# Patient Record
Sex: Female | Born: 1965 | Race: White | Hispanic: No | Marital: Single | Smoking: Current every day smoker
Health system: Southern US, Community
[De-identification: ages and names within clinical notes are randomized; demographics above are authoritative.]

## PROBLEM LIST (undated history)

## (undated) DIAGNOSIS — F32A Depression, unspecified: Secondary | ICD-10-CM

## (undated) DIAGNOSIS — M1990 Unspecified osteoarthritis, unspecified site: Secondary | ICD-10-CM

## (undated) DIAGNOSIS — N879 Dysplasia of cervix uteri, unspecified: Secondary | ICD-10-CM

## (undated) DIAGNOSIS — N643 Galactorrhea not associated with childbirth: Secondary | ICD-10-CM

## (undated) DIAGNOSIS — Z9289 Personal history of other medical treatment: Secondary | ICD-10-CM

## (undated) DIAGNOSIS — F419 Anxiety disorder, unspecified: Secondary | ICD-10-CM

## (undated) DIAGNOSIS — F329 Major depressive disorder, single episode, unspecified: Secondary | ICD-10-CM

## (undated) HISTORY — DX: Anxiety disorder, unspecified: F41.9

## (undated) HISTORY — PX: HYSTEROSCOPY: SHX211

## (undated) HISTORY — DX: Personal history of other medical treatment: Z92.89

## (undated) HISTORY — DX: Major depressive disorder, single episode, unspecified: F32.9

## (undated) HISTORY — DX: Unspecified osteoarthritis, unspecified site: M19.90

## (undated) HISTORY — DX: Depression, unspecified: F32.A

## (undated) HISTORY — PX: TUBAL LIGATION: SHX77

## (undated) HISTORY — DX: Dysplasia of cervix uteri, unspecified: N87.9

## (undated) HISTORY — DX: Galactorrhea not associated with childbirth: N64.3

---
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
## 1987-12-01 HISTORY — PX: CERVICAL CONE BIOPSY: SUR198

## 2008-11-21 ENCOUNTER — Observation Stay: Payer: Self-pay

## 2009-01-16 ENCOUNTER — Inpatient Hospital Stay: Payer: Self-pay | Admitting: Obstetrics and Gynecology

## 2009-01-16 DIAGNOSIS — O321XX0 Maternal care for breech presentation, not applicable or unspecified: Secondary | ICD-10-CM

## 2009-11-30 DIAGNOSIS — N643 Galactorrhea not associated with childbirth: Secondary | ICD-10-CM

## 2009-11-30 HISTORY — DX: Galactorrhea not associated with childbirth: N64.3

## 2010-03-03 ENCOUNTER — Ambulatory Visit: Payer: Self-pay | Admitting: Obstetrics & Gynecology

## 2010-04-16 ENCOUNTER — Ambulatory Visit: Payer: Self-pay | Admitting: Internal Medicine

## 2011-09-07 LAB — HM PAP SMEAR

## 2011-12-08 LAB — HM MAMMOGRAPHY

## 2012-03-29 DIAGNOSIS — Z9289 Personal history of other medical treatment: Secondary | ICD-10-CM

## 2012-03-29 HISTORY — DX: Personal history of other medical treatment: Z92.89

## 2012-04-05 ENCOUNTER — Ambulatory Visit: Payer: Self-pay

## 2012-05-03 ENCOUNTER — Ambulatory Visit: Payer: Self-pay | Admitting: Obstetrics and Gynecology

## 2012-05-03 LAB — URINALYSIS, COMPLETE
Bilirubin,UR: NEGATIVE
Ketone: NEGATIVE
Leukocyte Esterase: NEGATIVE
Nitrite: NEGATIVE
Ph: 7 (ref 4.5–8.0)
RBC,UR: <1 /HPF (ref 0–5)
Specific Gravity: 1.004 (ref 1.003–1.030)
WBC UR: NONE SEEN /HPF (ref 0–5)

## 2012-05-03 LAB — CBC
HGB: 13 g/dL (ref 12.0–16.0)
MCH: 30.1 pg (ref 26.0–34.0)
MCHC: 32.9 g/dL (ref 32.0–36.0)
Platelet: 310 10*3/uL (ref 150–440)
WBC: 9.9 10*3/uL (ref 3.6–11.0)

## 2012-05-03 LAB — PREGNANCY, URINE: Pregnancy Test, Urine: NEGATIVE m[IU]/mL

## 2012-05-12 ENCOUNTER — Ambulatory Visit: Payer: Self-pay | Admitting: Obstetrics and Gynecology

## 2012-05-16 LAB — PATHOLOGY REPORT

## 2013-09-19 DIAGNOSIS — Z9289 Personal history of other medical treatment: Secondary | ICD-10-CM

## 2013-09-19 HISTORY — DX: Personal history of other medical treatment: Z92.89

## 2014-06-22 DIAGNOSIS — E559 Vitamin D deficiency, unspecified: Secondary | ICD-10-CM | POA: Insufficient documentation

## 2014-06-22 DIAGNOSIS — E221 Hyperprolactinemia: Secondary | ICD-10-CM | POA: Insufficient documentation

## 2014-06-22 DIAGNOSIS — N63 Unspecified lump in unspecified breast: Secondary | ICD-10-CM | POA: Insufficient documentation

## 2015-03-24 NOTE — Op Note (Signed)
PATIENT NAME:  Bianca Clay, Bianca Clay MR#:  [id number] DATE OF BIRTH:  1966-07-26  DATE OF PROCEDURE:  05/12/2012  PREOPERATIVE DIAGNOSIS: Submucosal fibroid.   POSTOPERATIVE DIAGNOSIS: Endometrial polyp.   PROCEDURE: Operative hysteroscopy with removal of polyp using MyoSure.   ANESTHESIA: General.   SURGEON: Sheryl Towell A. Weaver-Lee, MD   ESTIMATED BLOOD LOSS: Minimal.   OPERATIVE FLUIDS: 1 liter.   COMPLICATIONS: None.   FINDINGS: Polyp on the right posterior endometrium.   SPECIMEN TYPE: Endometrial polyp.   INDICATIONS: The patient is a 49 year old who presents with irregular menses and was found to have a filling defect in her endometrium by ultrasound as well as sonohysterogram. The patient desired operative management. Risks, benefits, indications, and alternatives of the procedure were explained and informed consent was obtained.   PROCEDURE: The patient was taken to the operating room with IV fluids running. She was prepped and draped in the usual sterile fashion in candy-cane stirrups. Speculum was placed inside the vagina. The anterior lip of the cervix was grasped with a single-tooth tenaculum. The uterus sounded to 8 cm. The cervix was dilated using a Hank dilator up to a #15. The MyoSure diagnostic hysteroscope was placed and findings were as previously stated. The rotating outflow channel was removed from the hysteroscope and the MyoSure apparatus was placed. Per manufacturer's instructions, the bladed area was placed over the endometrial polyp. Foot pedal was used to activate the blade and the polyp was morcellated. All polyp pieces were removed through the hysteroscope channel via suction and sent to pathology. At the end of the procedure all instrumentation was removed from the patient's uterus and vagina. Surgical areas were hemostatic at the end of the case. Sponge and instrument counts were correct x2. The patient was awakened from anesthesia and taken to the recovery room in  stable condition.    ____________________________ Rolm Gala Ferne Reus, MD law:drc D: 05/12/2012 16:00:03 ET T: 05/13/2012 06:58:42 ET JOB#: [id number]  cc: Sherlynn Carbon A. Ferne Reus, MD, <Dictator> Rolm Gala WEAVER LEE MD ELECTRONICALLY SIGNED 05/13/2012 8:29

## 2015-05-03 ENCOUNTER — Encounter: Payer: Self-pay | Admitting: Podiatry

## 2015-05-03 ENCOUNTER — Ambulatory Visit (INDEPENDENT_AMBULATORY_CARE_PROVIDER_SITE_OTHER): Payer: No Typology Code available for payment source

## 2015-05-03 ENCOUNTER — Ambulatory Visit (INDEPENDENT_AMBULATORY_CARE_PROVIDER_SITE_OTHER): Payer: No Typology Code available for payment source | Admitting: Podiatry

## 2015-05-03 VITALS — BP 118/63 | HR 66 | Resp 16 | Ht 68.0 in | Wt 210.0 lb

## 2015-05-03 DIAGNOSIS — M722 Plantar fascial fibromatosis: Secondary | ICD-10-CM | POA: Diagnosis not present

## 2015-05-03 DIAGNOSIS — Q6650 Congenital pes planus, unspecified foot: Secondary | ICD-10-CM | POA: Diagnosis not present

## 2015-05-03 DIAGNOSIS — M2010 Hallux valgus (acquired), unspecified foot: Secondary | ICD-10-CM

## 2015-05-03 MED ORDER — METHYLPREDNISOLONE 4 MG PO TBPK: ORAL_TABLET | ORAL | Status: DC

## 2015-05-03 MED ORDER — MELOXICAM 15 MG PO TABS: 15.0000 mg | ORAL_TABLET | Freq: Every day | ORAL | Status: DC

## 2015-05-03 NOTE — Progress Notes (Signed)
   Subjective:    Patient ID: Bianca Clay, female    DOB: [birth year], 49 y.o.   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 i feel crippled by the  End of the day. I bought new shoes and want to make sure they are a good pair for me,.    Review of Systems  All other systems reviewed and are negative.      Objective:   Physical Exam: I have reviewed her past medical history medications allergy surgery social history and review of systems. Pulses are strongly palpable bilateral. Neurologic sensorium is intact percent lasting monofilament. Deep tendon reflexes are intact bilateral and muscle strength +5 over 5 dorsiflexion plantar flexors and inverters everters all intrinsic musculature is intact. Orthopedic evaluation demonstrates all joints distal to the ankle for range of motion without crepitation. She has moderate hallux valgus deformities left greater than right. She has mild pes planus with pain on palpation medial calcaneal tubercles bilaterally. Radiographs confirm plantar distally oriented calcaneal heel spurs with soft tissue increase in density at the plantar fascial calcaneal insertion site bilaterally. Hallux valgus deformities are noted bilateral.        Assessment & Plan:  Assessment: Plantar fasciitis bilateral with pes planus and hallux valgus bilateral.  Plan: Injected the bilateral heels today with Kenalog and local and aesthetic. Dispensed plantar fascial braces and a night splint. Dispensed a prescription for Medrol Dosepak to be followed by meloxicam. We discussed the etiology pathology conservative versus surgical therapies. We discussed the long-term benefits of orthotics. We discussed appropriate shoe gear stretching exercises ice therapy and shoe modifications. She was scanned for orthotics today.

## 2015-06-05 ENCOUNTER — Ambulatory Visit (INDEPENDENT_AMBULATORY_CARE_PROVIDER_SITE_OTHER): Payer: No Typology Code available for payment source | Admitting: Podiatry

## 2015-06-05 ENCOUNTER — Encounter: Payer: Self-pay | Admitting: Podiatry

## 2015-06-05 DIAGNOSIS — M722 Plantar fascial fibromatosis: Secondary | ICD-10-CM

## 2015-06-05 NOTE — Progress Notes (Signed)
Patient presents today to pick up her orthotics. She states that her feet have been feeling much better with the anti-inflammatory medication. I reviewed and dispensed a copy of the orthotic instructions. Advised she may want to continue with meloxicam while adjusting to the orthotics. Follow up appointment was made for 1 month to see Dr. Milinda Pointer for follow up.

## 2015-06-05 NOTE — Patient Instructions (Signed)

## 2015-07-04 DIAGNOSIS — F329 Major depressive disorder, single episode, unspecified: Secondary | ICD-10-CM

## 2015-07-04 DIAGNOSIS — J309 Allergic rhinitis, unspecified: Secondary | ICD-10-CM | POA: Insufficient documentation

## 2015-07-04 DIAGNOSIS — Z720 Tobacco use: Secondary | ICD-10-CM

## 2015-07-04 DIAGNOSIS — F419 Anxiety disorder, unspecified: Secondary | ICD-10-CM | POA: Insufficient documentation

## 2015-07-04 DIAGNOSIS — F32A Depression, unspecified: Secondary | ICD-10-CM | POA: Insufficient documentation

## 2015-07-04 DIAGNOSIS — F17210 Nicotine dependence, cigarettes, uncomplicated: Secondary | ICD-10-CM | POA: Insufficient documentation

## 2015-07-08 ENCOUNTER — Ambulatory Visit: Payer: No Typology Code available for payment source | Admitting: Podiatry

## 2015-07-08 ENCOUNTER — Ambulatory Visit (INDEPENDENT_AMBULATORY_CARE_PROVIDER_SITE_OTHER): Payer: No Typology Code available for payment source | Admitting: Unknown Physician Specialty

## 2015-07-08 ENCOUNTER — Encounter: Payer: Self-pay | Admitting: Unknown Physician Specialty

## 2015-07-08 VITALS — BP 106/69 | HR 90 | Temp 98.4°F | Ht 68.0 in | Wt 201.8 lb

## 2015-07-08 DIAGNOSIS — F329 Major depressive disorder, single episode, unspecified: Secondary | ICD-10-CM

## 2015-07-08 DIAGNOSIS — F32A Depression, unspecified: Secondary | ICD-10-CM

## 2015-07-08 MED ORDER — VENLAFAXINE HCL ER 37.5 MG PO CP24: 37.5000 mg | ORAL_CAPSULE | Freq: Every day | ORAL | Status: DC

## 2015-07-08 NOTE — Assessment & Plan Note (Signed)
Poor control.  Has a lot of stressors.  Venlafaxine helped in the past.  Gave a list of counselors.

## 2015-07-08 NOTE — Progress Notes (Signed)
   BP 106/69 mmHg  Pulse 90  Temp(Src) 98.4 F (36.9 C)  Ht 5\' 8"  (1.727 m)  Wt 201 lb 12.8 oz (91.536 kg)  BMI 30.69 kg/m2  SpO2 97%  LMP 06/26/2015 (Exact Date)   Subjective:    Patient ID: Bianca Clay, female    DOB: [birth year], 49 y.o.   MRN: [id number]  HPI: Bianca Clay is a 49 y.o. female  Chief Complaint  Patient presents with  . Depression   Depression Pt is here for a combination of stress, depression, and anxiety. She is on Dostinex through Dr. Eddie Dibbles for a pituitary adenoma.   She has been on this for many years now.  She has a lot of stress with a difficult child She just filed for bankruptsy.  She can sleep all day if she allowed herself, feeling tired and feeling bad about herself.  Her child is getting counseling.    Relevant past medical, surgical, family and social history reviewed and updated as indicated. Interim medical history since our last visit reviewed. Allergies and medications reviewed and updated.  Review of Systems  Constitutional: Negative.   HENT: Negative.   Eyes: Negative.   Respiratory: Negative.   Cardiovascular: Negative.   Gastrointestinal: Negative.   Endocrine: Negative.   Genitourinary: Negative.   Musculoskeletal: Negative.   Skin: Negative.   Allergic/Immunologic: Negative.   Neurological: Negative.   Hematological: Negative.   Psychiatric/Behavioral: Negative.     Per HPI unless specifically indicated above     Objective:    BP 106/69 mmHg  Pulse 90  Temp(Src) 98.4 F (36.9 C)  Ht 5\' 8"  (1.727 m)  Wt 201 lb 12.8 oz (91.536 kg)  BMI 30.69 kg/m2  SpO2 97%  LMP 06/26/2015 (Exact Date)  Wt Readings from Last 3 Encounters:  07/08/15 201 lb 12.8 oz (91.536 kg)  04/11/14 201 lb (91.173 kg)  05/03/15 210 lb (95.255 kg)    Physical Exam  Constitutional: She is oriented to person, place, and time. She appears well-developed and well-nourished. No distress.  HENT:  Head: Normocephalic and atraumatic.  Eyes:  Conjunctivae and lids are normal. Right eye exhibits no discharge. Left eye exhibits no discharge. No scleral icterus.  Cardiovascular: Normal rate, regular rhythm and normal heart sounds.   Pulmonary/Chest: Effort normal and breath sounds normal. No respiratory distress.  Abdominal: Normal appearance. There is no splenomegaly or hepatomegaly.  Musculoskeletal: Normal range of motion.  Neurological: She is alert and oriented to person, place, and time.  Skin: Skin is intact. No rash noted. No pallor.  Psychiatric: She has a normal mood and affect. Her behavior is normal. Judgment and thought content normal.      Assessment & Plan:   Problem List Items Addressed This Visit      Unprioritized   Depression - Primary    Poor control.  Has a lot of stressors.  Venlafaxine helped in the past.  Gave a list of counselors.        Relevant Medications   venlafaxine XR (EFFEXOR XR) 37.5 MG 24 hr capsule       Follow up plan: Return in about 4 weeks (around 08/05/2015).

## 2015-08-02 ENCOUNTER — Telehealth: Payer: Self-pay | Admitting: Unknown Physician Specialty

## 2015-08-02 NOTE — Telephone Encounter (Signed)
Pt supposed to fu on Sept 12, she is booked with Dr Wynetta Emery, not sure why other than maybe because [facility] Mood is away.  She wanted to follow up with Malachy Mood but starts a new job on the 12th.  Mal[facility] Mood is full next week.  Pt states is just to fu on starting new med, can she talk to [place]? Can we give her a 15 min appt?  Please call pt to let her know what she should do.

## 2015-08-02 NOTE — Telephone Encounter (Signed)
Routing to provider. Do you want to call and speak with her or do you want her to come in for a 15 minute appointment?

## 2015-08-02 NOTE — Telephone Encounter (Signed)
Please put her in for a 15 minute appointment next week.

## 2015-08-12 ENCOUNTER — Ambulatory Visit (INDEPENDENT_AMBULATORY_CARE_PROVIDER_SITE_OTHER): Payer: No Typology Code available for payment source | Admitting: Family Medicine

## 2015-08-12 ENCOUNTER — Encounter: Payer: Self-pay | Admitting: Family Medicine

## 2015-08-12 VITALS — BP 132/77 | HR 74 | Temp 99.3°F | Ht 67.9 in | Wt 200.4 lb

## 2015-08-12 DIAGNOSIS — F329 Major depressive disorder, single episode, unspecified: Secondary | ICD-10-CM

## 2015-08-12 DIAGNOSIS — F32A Depression, unspecified: Secondary | ICD-10-CM

## 2015-08-12 MED ORDER — VENLAFAXINE HCL ER 37.5 MG PO CP24: 75.0000 mg | ORAL_CAPSULE | Freq: Every day | ORAL | Status: DC

## 2015-08-12 NOTE — Assessment & Plan Note (Signed)
Under good control on current regimen. Continue current regimen. Follow up in 6 months on her depression and for PE.

## 2015-08-12 NOTE — Progress Notes (Signed)
BP 132/77 mmHg  Pulse 74  Temp(Src) 99.3 F (37.4 C)  Ht 5' 7.9" (1.725 m)  Wt 200 lb 6.4 oz (90.901 kg)  BMI 30.55 kg/m2  SpO2 96%   Subjective:    Patient ID: Bianca Clay, female    DOB: 1966-11-03, 49 y.o.   MRN: [id number]  HPI: Bianca Clay is a 49 y.o. female  Chief Complaint  Patient presents with  . Depression    pt states she has been taking 2 Effexor a day because that is what she thought she and Malachy Mood discussed   DEPRESSION Mood status: controlled Satisfied with current treatment?: no Symptom severity: mild  Duration of current treatment : 1 months Side effects: no Medication compliance: excellent compliance Depressed mood: yes Anxious mood: yes Anhedonia: no Significant weight loss or gain: no Insomnia: yes hard to stay asleep Fatigue: yes Feelings of worthlessness or guilt: no Impaired concentration/indecisiveness: no Suicidal ideations: no Hopelessness: no Crying spells: no Depression screen [facility] 2/9 08/12/2015 07/08/2015  Decreased Interest 0 2  Down, Depressed, Hopeless 1 3  PHQ - 2 Score 1 5  Altered sleeping 1 2  Tired, decreased energy 1 1  Change in appetite 1 1  Feeling bad or failure about yourself  0 0  Trouble concentrating 0 0  Moving slowly or fidgety/restless 0 0  Suicidal thoughts 0 0  PHQ-9 Score 4 9    Relevant past medical, surgical, family and social history reviewed and updated as indicated. Interim medical history since our last visit reviewed. Allergies and medications reviewed and updated.  Review of Systems  Constitutional: Negative.   Respiratory: Negative.   Cardiovascular: Negative.   Psychiatric/Behavioral: Negative.     Per HPI unless specifically indicated above     Objective:    BP 132/77 mmHg  Pulse 74  Temp(Src) 99.3 F (37.4 C)  Ht 5' 7.9" (1.725 m)  Wt 200 lb 6.4 oz (90.901 kg)  BMI 30.55 kg/m2  SpO2 96%  Wt Readings from Last 3 Encounters:  08/12/15 200 lb 6.4 oz (90.901 kg)  07/08/15  201 lb 12.8 oz (91.536 kg)  04/11/14 201 lb (91.173 kg)    Physical Exam  Constitutional: She is oriented to person, place, and time. She appears well-developed and well-nourished. No distress.  HENT:  Head: Normocephalic and atraumatic.  Right Ear: Hearing normal.  Left Ear: Hearing normal.  Nose: Nose normal.  Eyes: Conjunctivae and lids are normal. Right eye exhibits no discharge. Left eye exhibits no discharge. No scleral icterus.  Cardiovascular: Normal rate, regular rhythm, normal heart sounds and intact distal pulses.  Exam reveals no gallop and no friction rub.   No murmur heard. Pulmonary/Chest: Effort normal and breath sounds normal. No respiratory distress. She has no wheezes. She has no rales. She exhibits no tenderness.  Musculoskeletal: Normal range of motion.  Neurological: She is alert and oriented to person, place, and time.  Skin: Skin is warm, dry and intact. No rash noted. No erythema. No pallor.  Psychiatric: She has a normal mood and affect. Her speech is normal and behavior is normal. Judgment and thought content normal. Cognition and memory are normal.  Nursing note and vitals reviewed.   Results for orders placed or performed in visit on 07/04/15  HM MAMMOGRAPHY  Result Value Ref Range   HM Mammogram from PP   HM PAP SMEAR  Result Value Ref Range   HM Pap smear from PP       Assessment & Plan:  Problem List Items Addressed This Visit      Other   Depression - Primary    Under good control on current regimen. Continue current regimen. Follow up in 6 months on her depression and for PE.       Relevant Medications   venlafaxine XR (EFFEXOR XR) 37.5 MG 24 hr capsule       Follow up plan: Return in about 6 months (around 02/09/2016) for PE with Malachy Mood.

## 2016-02-10 ENCOUNTER — Ambulatory Visit: Payer: No Typology Code available for payment source | Admitting: Unknown Physician Specialty

## 2016-04-02 ENCOUNTER — Ambulatory Visit (INDEPENDENT_AMBULATORY_CARE_PROVIDER_SITE_OTHER): Payer: BLUE CROSS/BLUE SHIELD | Admitting: Podiatry

## 2016-04-02 ENCOUNTER — Encounter: Payer: Self-pay | Admitting: Podiatry

## 2016-04-02 ENCOUNTER — Ambulatory Visit (INDEPENDENT_AMBULATORY_CARE_PROVIDER_SITE_OTHER): Payer: BLUE CROSS/BLUE SHIELD

## 2016-04-02 VITALS — BP 120/74 | HR 79 | Resp 18

## 2016-04-02 DIAGNOSIS — M7662 Achilles tendinitis, left leg: Secondary | ICD-10-CM | POA: Diagnosis not present

## 2016-04-02 DIAGNOSIS — R52 Pain, unspecified: Secondary | ICD-10-CM

## 2016-04-02 DIAGNOSIS — M722 Plantar fascial fibromatosis: Secondary | ICD-10-CM | POA: Diagnosis not present

## 2016-04-02 MED ORDER — MELOXICAM 15 MG PO TABS: 15.0000 mg | ORAL_TABLET | Freq: Every day | ORAL | Status: DC

## 2016-04-02 NOTE — Patient Instructions (Signed)

## 2016-04-02 NOTE — Progress Notes (Signed)
Patient ID: Bianca Clay, female   DOB: Jun 20, 1966, 50 y.o.   MRN: [id number]  Subjective: 50 year old female presents the office they for recurrent left heel pain which has been ongoing for approximately one month. She states that she has not been wearing shoes and orthotics and she is wearing flip-flops when the pain started. She feels a pulling sensation to her heel. She denies any recent injury or trauma. His been no swelling or redness. No tingling or numbness. She's had no recent treatment. The pain does not wake her up at night. Denies any systemic complaints such as fevers, chills, nausea, vomiting. No acute changes since last appointment, and no other complaints at this time.   Objective: AAO x3, NAD DP/PT pulses palpable bilaterally, CRT less than 3 seconds Tenderness to palpation along the plantar medial tubercle of the calcaneus at the insertion of plantar fascia on the leftfoot. There is no pain along the course of the plantar fascia within the arch of the foot. Plantar fascia appears to be intact. There is no pain with lateral compression of the calcaneus or pain with vibratory sensation. There is mild pain along the insertion of the achilles tendon. Thompson test is negative. No other areas of tenderness to bilateral lower extremities. Equinus is present.  No areas of pinpoint bony tenderness or pain with vibratory sensation. MMT 5/5, ROM WNL. No edema, erythema, increase in warmth to bilateral lower extremities.  No open lesions or pre-ulcerative lesions.  No pain with calf compression, swelling, warmth, erythema  Assessment: 50 year old female with reoccurant left heel pain, likely planar fasciitis and achilles tendonitis  Plan: -All treatment options discussed with the patient including all alternatives, risks, complications.  -X-rays were obtained and reviewed with the patient. Small inferior calcaneal spur present. No evidence of acute fracture. -Etiology of symptoms were  discussed -Patient elects to proceed with steroid injection into the left heel. Under sterile skin preparation, a total of 2.5cc of kenalog 10, 0.5% Marcaine plain, and 2% lidocaine plain were infiltrated into the symptomatic area without complication. A band-aid was applied. Patient tolerated the injection well without complication. Post-injection care with discussed with the patient. Discussed with the patient to ice the area over the next couple of days to help prevent a steroid flare.  -Night splint dispensed(her dog ate her last one) -Stretching -Icing -Continue supportive shoe gear and orthotics. -F/U 3 weeks -Patient encouraged to call the office with any questions, concerns, change in symptoms.   Celesta Gentile, DPM

## 2016-04-02 NOTE — Addendum Note (Signed)
Addended by: Celesta Gentile R on: 04/02/2016 03:52 PM   Modules accepted: Orders

## 2016-04-23 ENCOUNTER — Ambulatory Visit (INDEPENDENT_AMBULATORY_CARE_PROVIDER_SITE_OTHER): Payer: BLUE CROSS/BLUE SHIELD | Admitting: Podiatry

## 2016-04-23 ENCOUNTER — Encounter: Payer: Self-pay | Admitting: Podiatry

## 2016-04-23 VITALS — BP 121/71 | HR 66 | Resp 18

## 2016-04-23 DIAGNOSIS — M79673 Pain in unspecified foot: Secondary | ICD-10-CM

## 2016-04-23 DIAGNOSIS — M722 Plantar fascial fibromatosis: Secondary | ICD-10-CM | POA: Diagnosis not present

## 2016-04-30 NOTE — Progress Notes (Signed)
Patient ID: Bianca Clay, female   DOB: 1966/07/12, 50 y.o.   MRN: [id number]  Subjective: 25 mL female presents the office today for flap evaluated the left heel pain, plantar fasciitis as well as Achilles tendinitis. She states that she is doing significantly better than last appointment. She gets an occasional "ting" to the area but is much improved. She has continue with stretching, icing as well as supportive shoes. Denies any systemic complaints such as fevers, chills, nausea, vomiting. No acute changes since last appointment, and no other complaints at this time.   Objective: AAO x3, NAD DP/PT pulses palpable bilaterally, CRT less than 3 seconds There is no tenderness to palpation along the plantar medial tubercle of the calcaneus at the insertion of plantar fascia on the left foot. There is no pain along the course of the plantar fascia within the arch of the foot. Plantar fascia appears to be intact. There is no pain with lateral compression of the calcaneus or pain with vibratory sensation. There is no pain along the course or insertion of the achilles tendon. No other areas of tenderness to bilateral lower extremities. No areas of pinpoint bony tenderness or pain with vibratory sensation. MMT 5/5, ROM WNL. No edema, erythema, increase in warmth to bilateral lower extremities.  No open lesions or pre-ulcerative lesions.  No pain with calf compression, swelling, warmth, erythema  Assessment: Resolved left heel pain, plantar fasciitis  Plan: -All treatment options discussed with the patient including all alternatives, risks, complications.  -Discussed methods to help prevent recurrence. Discussed continue stretching, icing and anti-inflammatories as needed. Supportive shoe gear and orthotics. Follow-up the patient symptoms recur. -Patient encouraged to call the office with any questions, concerns, change in symptoms.   Celesta Gentile, DPM

## 2016-08-28 ENCOUNTER — Other Ambulatory Visit: Payer: Self-pay | Admitting: Family Medicine

## 2016-08-28 NOTE — Telephone Encounter (Signed)
Your patient 

## 2016-09-25 ENCOUNTER — Telehealth: Payer: Self-pay | Admitting: *Deleted

## 2016-09-25 NOTE — Telephone Encounter (Signed)
REceived request for Meloxicam 15mg  #90. Dr. Jacqualyn Posey denied,pt not seen in office since 03/2016, needs an appt. Return fax as denied.

## 2016-09-27 ENCOUNTER — Other Ambulatory Visit: Payer: Self-pay | Admitting: Podiatry

## 2016-10-14 ENCOUNTER — Other Ambulatory Visit: Payer: Self-pay | Admitting: Podiatry

## 2017-02-01 ENCOUNTER — Ambulatory Visit (INDEPENDENT_AMBULATORY_CARE_PROVIDER_SITE_OTHER): Payer: BLUE CROSS/BLUE SHIELD | Admitting: Obstetrics and Gynecology

## 2017-02-01 VITALS — BP 110/70 | Ht 70.0 in | Wt 215.0 lb

## 2017-02-01 DIAGNOSIS — Z01419 Encounter for gynecological examination (general) (routine) without abnormal findings: Secondary | ICD-10-CM | POA: Diagnosis not present

## 2017-02-01 DIAGNOSIS — Z1231 Encounter for screening mammogram for malignant neoplasm of breast: Secondary | ICD-10-CM | POA: Diagnosis not present

## 2017-02-01 DIAGNOSIS — Z720 Tobacco use: Secondary | ICD-10-CM

## 2017-02-01 DIAGNOSIS — Z780 Asymptomatic menopausal state: Secondary | ICD-10-CM | POA: Diagnosis not present

## 2017-02-01 NOTE — Progress Notes (Signed)
HPI:      Ms. Bianca Clay is a 51 y.o. G1P1001 who LMP was No LMP recorded., presents today for her annual examination.  Her menses are absent due to menopause.  She is not sexually active  Last Pap: 05/14/15  Results were: no abnormalities  /neg HPV DNA. Hx of STDs: none Last mammogram: 05/14/15. Results were: normal--routine follow-up in 12 months There is no FH of breast or ovarian cancer. The patient does/ not do self-breast exams.   Tobacco use: The patient currently smokes 1 packs of cigarettes per day for the past many years. Alcohol use: none Exercise: moderately active  Lipids WNL 2016.  She does get adequate calcium and Vitamin D in her diet.  Past Medical History:  Diagnosis Date  . Anxiety   . Cervical dysplasia   . Depression   . Galactorrhea not associated with childbirth 2011   DR. BALL  . History of mammogram 09/19/2013   BIRAD I  . History of Papanicolaou smear of cervix 03/29/2012   -/-    Past Surgical History:  Procedure Laterality Date  . CERVICAL CONE BIOPSY  1989  . CESAREAN SECTION     X1  . HYSTEROSCOPY    . TONSILLECTOMY  1980  . TUBAL LIGATION      Family History  Problem Relation Age of Onset  . Diabetes Mother   . Hypertension Mother   . Diabetes Father   . Hypertension Father   . Depression Sister   . Hypertension Sister   . Allergies Son   . Depression Sister      ROS:  Review of Systems  Constitutional: Negative for fever, malaise/fatigue and weight loss.  HENT: Negative for congestion, ear pain and sinus pain.   Respiratory: Negative for cough, shortness of breath and wheezing.   Cardiovascular: Negative for chest pain, orthopnea and leg swelling.  Gastrointestinal: Negative for constipation, diarrhea, nausea and vomiting.  Genitourinary: Negative for dysuria, frequency, hematuria and urgency.  Musculoskeletal: Negative for back pain, joint pain and myalgias.  Skin: Negative for itching and rash.  Neurological:  Negative for dizziness, tingling, focal weakness and headaches.  Endo/Heme/Allergies: Negative for environmental allergies. Does not bruise/bleed easily.  Psychiatric/Behavioral: Negative for depression and suicidal ideas. The patient is not nervous/anxious and does not have insomnia.     Objective: BP 110/70 (BP Location: Right Arm, Patient Position: Sitting)   Ht 5\' 10"  (1.778 m)   Wt 215 lb (97.5 kg)   BMI 30.85 kg/m    Physical Exam  Constitutional: She is oriented to person, place, and time. She appears well-developed and well-nourished.  Genitourinary: Vagina normal and uterus normal. No erythema or tenderness in the vagina. No vaginal discharge found. Right adnexum does not display mass and does not display tenderness. Left adnexum does not display mass and does not display tenderness. Cervix does not exhibit motion tenderness or polyp. Uterus is not enlarged or tender.  Neck: Normal range of motion. No thyromegaly present.  Cardiovascular: Normal rate, regular rhythm and normal heart sounds.   No murmur heard. Pulmonary/Chest: Effort normal and breath sounds normal. Right breast exhibits no mass, no nipple discharge, no skin change and no tenderness. Left breast exhibits no mass, no nipple discharge, no skin change and no tenderness.  Abdominal: Soft. There is no tenderness. There is no guarding.  Musculoskeletal: Normal range of motion.  Neurological: She is alert and oriented to person, place, and time. No cranial nerve deficit.  Psychiatric: She has a  normal mood and affect. Her behavior is normal.  Vitals reviewed.   Results: No results found for this or any previous visit (from the past 24 hour(s)).  Assessment: Encounter for annual routine gynecological examination  Screening mammogram, encounter for - Plan: MM DIGITAL SCREENING BILATERAL  Tobacco abuse - Encouraged smokingn cessation.   Postmenopause     Plan:            GYN counsel mammography screening,  adequate intake of calcium and vitamin D    Return in about 1 year (around 02/01/2018).  Bianca Clay B. Deavion Strider, PA-C 02/01/2017 8:17 AM

## 2017-04-05 ENCOUNTER — Ambulatory Visit
Admission: RE | Admit: 2017-04-05 | Discharge: 2017-04-05 | Disposition: A | Discharge status: Home / Self Care | Payer: BLUE CROSS/BLUE SHIELD | Source: Ambulatory Visit | Attending: Obstetrics and Gynecology | Admitting: Obstetrics and Gynecology

## 2017-04-05 DIAGNOSIS — Z1231 Encounter for screening mammogram for malignant neoplasm of breast: Secondary | ICD-10-CM | POA: Diagnosis present

## 2017-04-05 DIAGNOSIS — R928 Other abnormal and inconclusive findings on diagnostic imaging of breast: Secondary | ICD-10-CM | POA: Diagnosis not present

## 2017-04-07 ENCOUNTER — Other Ambulatory Visit: Payer: Self-pay | Admitting: Obstetrics and Gynecology

## 2017-04-07 DIAGNOSIS — N6489 Other specified disorders of breast: Secondary | ICD-10-CM

## 2017-04-07 DIAGNOSIS — R928 Other abnormal and inconclusive findings on diagnostic imaging of breast: Secondary | ICD-10-CM

## 2017-04-12 ENCOUNTER — Other Ambulatory Visit: Payer: Self-pay | Admitting: *Deleted

## 2017-04-12 ENCOUNTER — Inpatient Hospital Stay
Admission: RE | Admit: 2017-04-12 | Discharge: 2017-04-12 | Disposition: A | Discharge status: Home / Self Care | Payer: Self-pay | Source: Ambulatory Visit | Attending: *Deleted | Admitting: *Deleted

## 2017-04-12 DIAGNOSIS — Z9289 Personal history of other medical treatment: Secondary | ICD-10-CM

## 2017-04-13 ENCOUNTER — Other Ambulatory Visit: Payer: Self-pay | Admitting: Unknown Physician Specialty

## 2017-04-15 ENCOUNTER — Ambulatory Visit
Admission: RE | Admit: 2017-04-15 | Discharge: 2017-04-15 | Disposition: A | Discharge status: Home / Self Care | Payer: BLUE CROSS/BLUE SHIELD | Source: Ambulatory Visit | Attending: Obstetrics and Gynecology | Admitting: Obstetrics and Gynecology

## 2017-04-15 ENCOUNTER — Encounter: Payer: Self-pay | Admitting: Obstetrics and Gynecology

## 2017-04-15 DIAGNOSIS — N6489 Other specified disorders of breast: Secondary | ICD-10-CM

## 2017-04-15 DIAGNOSIS — R928 Other abnormal and inconclusive findings on diagnostic imaging of breast: Secondary | ICD-10-CM

## 2018-03-27 ENCOUNTER — Other Ambulatory Visit: Payer: Self-pay | Admitting: Unknown Physician Specialty

## 2018-07-08 ENCOUNTER — Encounter: Payer: Self-pay | Admitting: Family Medicine

## 2018-07-08 ENCOUNTER — Other Ambulatory Visit: Payer: Self-pay

## 2018-07-08 ENCOUNTER — Ambulatory Visit: Payer: BLUE CROSS/BLUE SHIELD | Admitting: Family Medicine

## 2018-07-08 VITALS — BP 111/69 | HR 85 | Temp 98.1°F | Ht 68.0 in | Wt 203.6 lb

## 2018-07-08 DIAGNOSIS — F331 Major depressive disorder, recurrent, moderate: Secondary | ICD-10-CM | POA: Diagnosis not present

## 2018-07-08 MED ORDER — SERTRALINE HCL 50 MG PO TABS: 50.0000 mg | ORAL_TABLET | Freq: Every day | ORAL | 1 refills | Status: DC

## 2018-07-08 NOTE — Progress Notes (Signed)
BP 111/69   Pulse 85   Temp 98.1 F (36.7 C) (Oral)   Ht 5\' 8"  (1.727 m)   Wt 203 lb 9.6 oz (92.4 kg)   LMP 06/26/2015 (Exact Date)   SpO2 95%   BMI 30.96 kg/m    Subjective:    Patient ID: Bianca Clay, female    DOB: [birth year], 52 y.o.   MRN: [id number]  HPI: [name] is a 52 y.o. female  Chief Complaint  Patient presents with  . Anxiety   Pt here today for worsening anxiety that she feels is exacerbated by her job stress. Constantly irritable and on edge. Feels she is snapping at her 50 year old son when she's home almost constantly. Previously on effexor, has been out for a few months. Was on this medication for years but at the end felt like it became much less effective. Denies Si/HI, severe mood swings, hallucinations, appetite issues, hopelessness.    Depression screen [facility] 2/9 08/12/2015 07/08/2015  Decreased Interest 0 2  Down, Depressed, Hopeless 1 3  PHQ - 2 Score 1 5  Altered sleeping 1 2  Tired, decreased energy 1 1  Change in appetite 1 1  Feeling bad or failure about yourself  0 0  Trouble concentrating 0 0  Moving slowly or fidgety/restless 0 0  Suicidal thoughts 0 0  PHQ-9 Score 4 9  No flowsheet data found.  Relevant past medical, surgical, family and social history reviewed and updated as indicated. Interim medical history since our last visit reviewed. Allergies and medications reviewed and updated.  Review of Systems  Per HPI unless specifically indicated above     Objective:    BP 111/69   Pulse 85   Temp 98.1 F (36.7 C) (Oral)   Ht 5\' 8"  (1.727 m)   Wt 203 lb 9.6 oz (92.4 kg)   LMP 06/26/2015 (Exact Date)   SpO2 95%   BMI 30.96 kg/m   Wt Readings from Last 3 Encounters:  07/08/18 203 lb 9.6 oz (92.4 kg)  02/01/17 215 lb (97.5 kg)  05/14/15 207 lb (93.9 kg)    Physical Exam  Constitutional: She is oriented to person, place, and time. She appears well-developed and well-nourished. No distress.  HENT:  Head:  Atraumatic.  Eyes: Conjunctivae and EOM are normal.  Neck: Normal range of motion. Neck supple.  Cardiovascular: Normal rate and regular rhythm.  Pulmonary/Chest: Effort normal and breath sounds normal.  Musculoskeletal: Normal range of motion.  Neurological: She is alert and oriented to person, place, and time.  Skin: Skin is warm and dry.  Psychiatric: She has a normal mood and affect. Her behavior is normal.  Nursing note and vitals reviewed.   Results for orders placed or performed in visit on 07/04/15  HM MAMMOGRAPHY  Result Value Ref Range   HM Mammogram from PP   HM PAP SMEAR  Result Value Ref Range   HM Pap smear from PP       Assessment & Plan:   Problem List Items Addressed This Visit      Other   Depression - Primary    Will try zoloft as the effexor was becoming less effective near the end. Risks and side effects reviewed. List of counselors given for coping strategies.       Relevant Medications   sertraline (ZOLOFT) 50 MG tablet       Follow up plan: Return in about 4 weeks (around 08/05/2018) for Mood f/u.

## 2018-07-11 NOTE — Patient Instructions (Signed)
Follow up in 1 month   

## 2018-07-11 NOTE — Assessment & Plan Note (Signed)
Will try zoloft as the effexor was becoming less effective near the end. Risks and side effects reviewed. List of counselors given for coping strategies.

## 2018-07-22 ENCOUNTER — Encounter: Payer: Self-pay | Admitting: Podiatry

## 2018-07-22 ENCOUNTER — Ambulatory Visit: Payer: BLUE CROSS/BLUE SHIELD | Admitting: Podiatry

## 2018-07-22 DIAGNOSIS — L600 Ingrowing nail: Secondary | ICD-10-CM | POA: Diagnosis not present

## 2018-07-22 NOTE — Patient Instructions (Signed)

## 2018-07-25 NOTE — Progress Notes (Signed)
   Subjective: Patient presents today for evaluation of pain to the lateral border of the left hallux that began 1-2 weeks ago. Patient is concerned for possible ingrown nail. Wearing closed toed shoes increases the pain. She has tried to cut the nail out herself unsuccessfully. Patient presents today for further treatment and evaluation.  Past Medical History:  Diagnosis Date  . Anxiety   . Cervical dysplasia   . Depression   . Galactorrhea not associated with childbirth 2011   DR. BALL  . History of mammogram 09/19/2013   BIRAD I  . History of Papanicolaou smear of cervix 03/29/2012   -/-    Objective:  General: Well developed, nourished, in no acute distress, alert and oriented x3   Dermatology: Skin is warm, dry and supple bilateral. Lateral border of the left hallux appears to be erythematous with evidence of an ingrowing nail. Pain on palpation noted to the border of the nail fold. The remaining nails appear unremarkable at this time. There are no open sores, lesions.  Vascular: Dorsalis Pedis artery and Posterior Tibial artery pedal pulses palpable. No lower extremity edema noted.   Neruologic: Grossly intact via light touch bilateral.  Musculoskeletal: Muscular strength within normal limits in all groups bilateral. Normal range of motion noted to all pedal and ankle joints.   Assesement: #1 Paronychia with ingrowing nail lateral border left hallux  #2 Pain in toe #3 Incurvated nail  Plan of Care:  1. Patient evaluated.  2. Discussed treatment alternatives and plan of care. Explained nail avulsion procedure and post procedure course to patient. 3. Patient opted for permanent partial nail avulsion.  4. Prior to procedure, local anesthesia infiltration utilized using 3 ml of a 50:50 mixture of 2% plain lidocaine and 0.5% plain marcaine in a normal hallux block fashion and a betadine prep performed.  5. Partial permanent nail avulsion with chemical matrixectomy performed  using [id number] applications of phenol followed by alcohol flush.  6. Light dressing applied. 7. Return to clinic in 2 weeks.   Edrick Kins, DPM [facility]  Dr. Edrick Kins, [address]                                        [place], [place] [address]                Office [phone]  Fax [phone]

## 2018-07-30 ENCOUNTER — Other Ambulatory Visit: Payer: Self-pay | Admitting: Family Medicine

## 2018-08-02 NOTE — Telephone Encounter (Signed)
Requesting 90 day refill, did not refill per policy due to patient is to follow up for depression on 08/05/18.  Zoloft refill Last Refill:07/08/18 # 30/1 refill Last OV: 07/08/18; Upcoming on 08/05/18 PCP: [place]: [id number] [place], [address] [phone] (Phone) [phone] (Fax)

## 2018-08-05 ENCOUNTER — Encounter: Payer: Self-pay | Admitting: Unknown Physician Specialty

## 2018-08-05 ENCOUNTER — Ambulatory Visit: Payer: BLUE CROSS/BLUE SHIELD | Admitting: Podiatry

## 2018-08-05 ENCOUNTER — Ambulatory Visit (INDEPENDENT_AMBULATORY_CARE_PROVIDER_SITE_OTHER): Payer: BLUE CROSS/BLUE SHIELD | Admitting: Unknown Physician Specialty

## 2018-08-05 VITALS — BP 121/79 | HR 84 | Temp 98.5°F | Ht 68.0 in | Wt 199.4 lb

## 2018-08-05 DIAGNOSIS — F419 Anxiety disorder, unspecified: Secondary | ICD-10-CM | POA: Diagnosis not present

## 2018-08-05 DIAGNOSIS — R05 Cough: Secondary | ICD-10-CM

## 2018-08-05 DIAGNOSIS — R059 Cough, unspecified: Secondary | ICD-10-CM

## 2018-08-05 NOTE — Progress Notes (Signed)
BP 121/79   Pulse 84   Temp 98.5 F (36.9 C) (Oral)   Ht 5\' 8"  (1.727 m)   Wt 199 lb 6.4 oz (90.4 kg)   LMP 06/26/2015 (Exact Date)   SpO2 98%   BMI 30.32 kg/m    Subjective:    Patient ID: Bianca Clay, female    DOB: [birth year], 52 y.o.   MRN: [id number]  HPI: Bianca Clay is a 52 y.o. female  Chief Complaint  Patient presents with  . Depression    4 week f./up   Pt was started on Sertraline about 3 weeks ago.  Had previously been on Effexor which stopped working "toward the end."  Pt states the Sertraline seems to work better and child has noticed she is no longer "mean."    Depression screen [facility] 2/9 08/05/2018 08/12/2015 07/08/2015  Decreased Interest 1 0 2  Down, Depressed, Hopeless 0 1 3  PHQ - 2 Score 1 1 5   Altered sleeping 1 1 2   Tired, decreased energy 1 1 1   Change in appetite 1 1 1   Feeling bad or failure about yourself  0 0 0  Trouble concentrating 0 0 0  Moving slowly or fidgety/restless 0 0 0  Suicidal thoughts 0 0 0  PHQ-9 Score 4 4 9    Cough  This is a new problem. The current episode started today. The problem has been unchanged. The problem occurs constantly. The cough is non-productive. Pertinent negatives include no fever, nasal congestion, sweats or weight loss. Nothing aggravates the symptoms. She has tried nothing for the symptoms. The treatment provided no relief.    Relevant past medical, surgical, family and social history reviewed and updated as indicated. Interim medical history since our last visit reviewed. Allergies and medications reviewed and updated.  Review of Systems  Constitutional: Negative for fever and weight loss.  Respiratory: Positive for cough.     Per HPI unless specifically indicated above     Objective:    BP 121/79   Pulse 84   Temp 98.5 F (36.9 C) (Oral)   Ht 5\' 8"  (1.727 m)   Wt 199 lb 6.4 oz (90.4 kg)   LMP 06/26/2015 (Exact Date)   SpO2 98%   BMI 30.32 kg/m   Wt Readings from Last 3  Encounters:  08/05/18 199 lb 6.4 oz (90.4 kg)  07/08/18 203 lb 9.6 oz (92.4 kg)  02/01/17 215 lb (97.5 kg)    Physical Exam  Constitutional: She is oriented to person, place, and time. She appears well-developed and well-nourished. No distress.  HENT:  Head: Normocephalic and atraumatic.  Eyes: Conjunctivae and lids are normal. Right eye exhibits no discharge. Left eye exhibits no discharge. No scleral icterus.  Neck: Normal range of motion. Neck supple. No JVD present. Carotid bruit is not present.  Cardiovascular: Normal rate, regular rhythm and normal heart sounds.  Pulmonary/Chest: Effort normal and breath sounds normal.  Abdominal: Normal appearance. There is no splenomegaly or hepatomegaly.  Musculoskeletal: Normal range of motion.  Neurological: She is alert and oriented to person, place, and time.  Skin: Skin is warm, dry and intact. No rash noted. No pallor.  Psychiatric: She has a normal mood and affect. Her behavior is normal. Judgment and thought content normal.    Results for orders placed or performed in visit on 07/04/15  HM MAMMOGRAPHY  Result Value Ref Range   HM Mammogram from PP   HM PAP SMEAR  Result Value Ref Range  HM Pap smear from PP       Assessment & Plan:   Problem List Items Addressed This Visit      Unprioritized   Chronic anxiety    Pt doing well with Sertraline.  Continue present dose.         Other Visit Diagnoses    Cough    -  Primary   New onset today.  Lungs CTA.  Discussed OTC treatment and rest       Follow up plan: Return for schedule PE as overdue HM.

## 2018-08-05 NOTE — Assessment & Plan Note (Signed)
Pt doing well with Sertraline.  Continue present dose.

## 2018-08-28 ENCOUNTER — Telehealth: Payer: Self-pay | Admitting: Family Medicine

## 2018-08-29 NOTE — Telephone Encounter (Signed)
Pt states she just saw Jasper on 9/06 and does her physicals with her OBGYN. Pt does not know why she needs to come in again. Also requesting her Rx be a 90 day   sertraline (ZOLOFT) 50 MG tablet   [id number] [place], [place] - [address] [phone] (Phone) [phone] (Fax)

## 2018-08-29 NOTE — Telephone Encounter (Signed)
Patient called, left VM to return call to the office to schedule appointment. Patient is past due for a CPE, unknown last CPE, noted last OV 08/05/18 to return for schedule PE as overdue HM.  Sertraline refill Last refill:07/08/18 #30/1 refill Last OV:08/05/18 HQN:[id number] Pharmacy: [id number] - [place], [place] [phone] (Phone) [phone] (Fax)

## 2018-09-08 ENCOUNTER — Other Ambulatory Visit: Payer: Self-pay | Admitting: Nurse Practitioner

## 2018-09-08 ENCOUNTER — Encounter: Payer: Self-pay | Admitting: Unknown Physician Specialty

## 2018-09-08 MED ORDER — SERTRALINE HCL 50 MG PO TABS: 50.0000 mg | ORAL_TABLET | Freq: Every day | ORAL | 2 refills | Status: DC

## 2018-09-08 NOTE — Telephone Encounter (Signed)
Pt would #90 w/refills . Pt had an appt 08-05-18

## 2018-09-08 NOTE — Progress Notes (Signed)
Patient requesting refill on Sertraline and to have it placed as 90 day supply.  Prescription refill sent to pharmacy with request for 90 day supply.

## 2018-09-21 ENCOUNTER — Ambulatory Visit (INDEPENDENT_AMBULATORY_CARE_PROVIDER_SITE_OTHER): Payer: BLUE CROSS/BLUE SHIELD | Admitting: Family Medicine

## 2018-09-21 ENCOUNTER — Encounter: Payer: Self-pay | Admitting: Family Medicine

## 2018-09-21 VITALS — BP 118/78 | HR 61 | Ht 67.0 in | Wt 196.0 lb

## 2018-09-21 DIAGNOSIS — Z1239 Encounter for other screening for malignant neoplasm of breast: Secondary | ICD-10-CM

## 2018-09-21 DIAGNOSIS — Z0000 Encounter for general adult medical examination without abnormal findings: Secondary | ICD-10-CM | POA: Diagnosis not present

## 2018-09-21 DIAGNOSIS — Z23 Encounter for immunization: Secondary | ICD-10-CM

## 2018-09-21 DIAGNOSIS — E559 Vitamin D deficiency, unspecified: Secondary | ICD-10-CM | POA: Diagnosis not present

## 2018-09-21 DIAGNOSIS — F419 Anxiety disorder, unspecified: Secondary | ICD-10-CM | POA: Diagnosis not present

## 2018-09-21 DIAGNOSIS — F17210 Nicotine dependence, cigarettes, uncomplicated: Secondary | ICD-10-CM

## 2018-09-21 DIAGNOSIS — F331 Major depressive disorder, recurrent, moderate: Secondary | ICD-10-CM

## 2018-09-21 LAB — UA/M W/RFLX CULTURE, ROUTINE
BILIRUBIN UA: NEGATIVE
GLUCOSE, UA: NEGATIVE
KETONES UA: NEGATIVE
Leukocytes, UA: NEGATIVE
NITRITE UA: NEGATIVE
Protein, UA: NEGATIVE
RBC UA: NEGATIVE
Specific Gravity, UA: 1.01 (ref 1.005–1.030)
UUROB: 0.2 mg/dL (ref 0.2–1.0)
pH, UA: 7 (ref 5.0–7.5)

## 2018-09-21 MED ORDER — HYDROXYZINE HCL 25 MG PO TABS: 25.0000 mg | ORAL_TABLET | Freq: Three times a day (TID) | ORAL | 1 refills | Status: DC | PRN

## 2018-09-21 MED ORDER — SERTRALINE HCL 100 MG PO TABS: 100.0000 mg | ORAL_TABLET | Freq: Every day | ORAL | 3 refills | Status: DC

## 2018-09-21 NOTE — Progress Notes (Signed)
BP 118/78   Pulse 61   Ht 5\' 7"  (1.702 m)   Wt 196 lb (88.9 kg)   LMP 06/26/2015 (Exact Date)   SpO2 98%   BMI 30.70 kg/m    Subjective:    Patient ID: Bianca Clay, female    DOB: [birth year], 52 y.o.   MRN: [id number]  HPI: Bianca Clay is a 52 y.o. female presenting on 09/21/2018 for comprehensive medical examination. Current medical complaints include:see below  Notes improvement in moods and anxiety since being on the zoloft. Still having some breakthrough sxs due to significant work and social stress. States her ex-husband is on trial for murder and son is severely ADHD so there's lots of home stress currently. Has been turning to cigarettes for stress relief and smoking more than usual. Does want to quit eventually when things calm down. Denies SI/HI.   She currently lives with: Menopausal Symptoms: no  Depression Screen done today and results listed below:  Depression screen [facility] 2/9 09/21/2018 08/05/2018 08/12/2015 07/08/2015  Decreased Interest 1 1 0 2  Down, Depressed, Hopeless 1 0 1 3  PHQ - 2 Score 2 1 1 5   Altered sleeping 1 1 1 2   Tired, decreased energy 0 1 1 1   Change in appetite 1 1 1 1   Feeling bad or failure about yourself  0 0 0 0  Trouble concentrating 0 0 0 0  Moving slowly or fidgety/restless 1 0 0 0  Suicidal thoughts 0 0 0 0  PHQ-9 Score 5 4 4 9     The patient does not have a history of falls. I did not complete a risk assessment for falls. A plan of care for falls was not documented.   Past Medical History:  Past Medical History:  Diagnosis Date  . Anxiety   . Cervical dysplasia   . Depression   . Galactorrhea not associated with childbirth 2011   DR. BALL  . History of mammogram 09/19/2013   BIRAD I  . History of Papanicolaou smear of cervix 03/29/2012   -/-    Surgical History:  Past Surgical History:  Procedure Laterality Date  . CERVICAL CONE BIOPSY  1989  . CESAREAN SECTION     X1  . HYSTEROSCOPY    . TONSILLECTOMY   1980  . TUBAL LIGATION      Medications:  Current Outpatient Medications on File Prior to Visit  Medication Sig  . Cholecalciferol (VITAMIN D PO) Take by mouth daily.  . Multiple Vitamin (MULTIVITAMIN) capsule Take 1 capsule by mouth daily.   No current facility-administered medications on file prior to visit.     Allergies:  No Known Allergies  Social History:  Social History   Socioeconomic History  . Marital status: Legally Separated    Spouse name: Not on file  . Number of children: Not on file  . Years of education: Not on file  . Highest education level: Not on file  Occupational History  . Not on file  Social Needs  . Financial resource strain: Not on file  . Food insecurity:    Worry: Not on file    Inability: Not on file  . Transportation needs:    Medical: Not on file    Non-medical: Not on file  Tobacco Use  . Smoking status: Current Every Day Smoker    Packs/day: 1.00    Types: Cigarettes  . Smokeless tobacco: Never Used  Substance and Sexual Activity  . Alcohol use: No  Alcohol/week: 0.0 standard drinks  . Drug use: No  . Sexual activity: Not Currently    Birth control/protection: Other-see comments    Comment: TUBAL LIGATION  Lifestyle  . Physical activity:    Days per week: Not on file    Minutes per session: Not on file  . Stress: Not on file  Relationships  . Social connections:    Talks on phone: Not on file    Gets together: Not on file    Attends religious service: Not on file    Active member of club or organization: Not on file    Attends meetings of clubs or organizations: Not on file    Relationship status: Not on file  . Intimate partner violence:    Fear of current or ex partner: Not on file    Emotionally abused: Not on file    Physically abused: Not on file    Forced sexual activity: Not on file  Other Topics Concern  . Not on file  Social History Narrative  . Not on file   Social History   Tobacco Use  Smoking  Status Current Every Day Smoker  . Packs/day: 1.00  . Types: Cigarettes  Smokeless Tobacco Never Used   Social History   Substance and Sexual Activity  Alcohol Use No  . Alcohol/week: 0.0 standard drinks    Family History:  Family History  Problem Relation Age of Onset  . Diabetes Mother   . Hypertension Mother   . Diabetes Father   . Hypertension Father   . Depression Sister   . Hypertension Sister   . Allergies Son   . Depression Sister   . Breast cancer Neg Hx    Past medical history, surgical history, medications, allergies, family history and social history reviewed with patient today and changes made to appropriate areas of the chart.   Review of Systems - General ROS: negative Psychological ROS: positive for - anxiety and depression Ophthalmic ROS: negative ENT ROS: negative Allergy and Immunology ROS: negative Hematological and Lymphatic ROS: negative Endocrine ROS: negative Breast ROS: negative for breast lumps Respiratory ROS: no cough, shortness of breath, or wheezing Cardiovascular ROS: no chest pain or dyspnea on exertion Gastrointestinal ROS: no abdominal pain, change in bowel habits, or black or bloody stools Genito-Urinary ROS: no dysuria, trouble voiding, or hematuria Musculoskeletal ROS: negative Neurological ROS: no TIA or stroke symptoms Dermatological ROS: negative All other ROS negative except what is listed above and in the HPI.      Objective:    BP 118/78   Pulse 61   Ht 5\' 7"  (1.702 m)   Wt 196 lb (88.9 kg)   LMP 06/26/2015 (Exact Date)   SpO2 98%   BMI 30.70 kg/m   Wt Readings from Last 3 Encounters:  09/21/18 196 lb (88.9 kg)  08/05/18 199 lb 6.4 oz (90.4 kg)  07/08/18 203 lb 9.6 oz (92.4 kg)    Physical Exam  Constitutional: She is oriented to person, place, and time. She appears well-developed and well-nourished. No distress.  HENT:  Head: Atraumatic.  Right Ear: External ear normal.  Left Ear: External ear normal.  Nose:  Nose normal.  Mouth/Throat: Oropharynx is clear and moist. No oropharyngeal exudate.  Eyes: Pupils are equal, round, and reactive to light. Conjunctivae are normal. No scleral icterus.  Neck: Normal range of motion. Neck supple. No thyromegaly present.  Cardiovascular: Normal rate, regular rhythm, normal heart sounds and intact distal pulses.  Pulmonary/Chest: Effort normal and  breath sounds normal. No respiratory distress.  Breast exam to be done at [facility] exam through GYN  Abdominal: Soft. Bowel sounds are normal. She exhibits no mass. There is no tenderness.  Genitourinary:  Genitourinary Comments: Pelvic exam to be done at GYN  Musculoskeletal: Normal range of motion. She exhibits no edema or tenderness.  Lymphadenopathy:    She has no cervical adenopathy.  Neurological: She is alert and oriented to person, place, and time. No cranial nerve deficit.  Skin: Skin is warm and dry. No rash noted.  Psychiatric: She has a normal mood and affect. Her behavior is normal.  Nursing note and vitals reviewed.   Results for orders placed or performed in visit on 09/21/18  CBC with Differential/Platelet  Result Value Ref Range   WBC 14.0 (H) 3.4 - 10.8 x10E3/uL   RBC 4.86 3.77 - 5.28 x10E6/uL   Hemoglobin 14.6 11.1 - 15.9 g/dL   Hematocrit 42.9 34.0 - 46.6 %   MCV 88 79 - 97 fL   MCH 30.0 26.6 - 33.0 pg   MCHC 34.0 31.5 - 35.7 g/dL   RDW 13.0 12.3 - 15.4 %   Platelets 402 150 - 450 x10E3/uL   Neutrophils 71 Not Estab. %   Lymphs 22 Not Estab. %   Monocytes 5 Not Estab. %   Eos 2 Not Estab. %   Basos 0 Not Estab. %   Neutrophils Absolute 9.9 (H) 1.4 - 7.0 x10E3/uL   Lymphocytes Absolute 3.1 0.7 - 3.1 x10E3/uL   Monocytes Absolute 0.7 0.1 - 0.9 x10E3/uL   EOS (ABSOLUTE) 0.3 0.0 - 0.4 x10E3/uL   Basophils Absolute 0.1 0.0 - 0.2 x10E3/uL   Immature Granulocytes 0 Not Estab. %   Immature Grans (Abs) 0.0 0.0 - 0.1 x10E3/uL  Comprehensive metabolic panel  Result Value Ref Range    Glucose 83 65 - 99 mg/dL   BUN 8 6 - 24 mg/dL   Creatinine, Ser 0.67 0.57 - 1.00 mg/dL   GFR calc non Af Amer 104 >59 mL/min/1.73   GFR calc Af Amer 119 >59 mL/min/1.73   BUN/Creatinine Ratio 12 9 - 23   Sodium 139 134 - 144 mmol/L   Potassium 4.6 3.5 - 5.2 mmol/L   Chloride 101 96 - 106 mmol/L   CO2 25 20 - 29 mmol/L   Calcium 10.2 8.7 - 10.2 mg/dL   Total Protein 6.7 6.0 - 8.5 g/dL   Albumin 4.7 3.5 - 5.5 g/dL   Globulin, Total 2.0 1.5 - 4.5 g/dL   Albumin/Globulin Ratio 2.4 (H) 1.2 - 2.2   Bilirubin Total 0.3 0.0 - 1.2 mg/dL   Alkaline Phosphatase 105 39 - 117 IU/L   AST 18 0 - 40 IU/L   ALT 18 0 - 32 IU/L  Lipid Panel w/o Chol/HDL Ratio  Result Value Ref Range   Cholesterol, Total 221 (H) 100 - 199 mg/dL   Triglycerides 158 (H) 0 - 149 mg/dL   HDL 37 (L) >39 mg/dL   VLDL Cholesterol Cal 32 5 - 40 mg/dL   LDL Calculated 152 (H) 0 - 99 mg/dL  TSH  Result Value Ref Range   TSH 1.050 0.450 - 4.500 uIU/mL  UA/M w/rflx Culture, Routine  Result Value Ref Range   Specific Gravity, UA 1.010 1.005 - 1.030   pH, UA 7.0 5.0 - 7.5   Color, UA Yellow Yellow   Appearance Ur Clear Clear   Leukocytes, UA Negative Negative   Protein, UA Negative Negative/Trace  Glucose, UA Negative Negative   Ketones, UA Negative Negative   RBC, UA Negative Negative   Bilirubin, UA Negative Negative   Urobilinogen, Ur 0.2 0.2 - 1.0 mg/dL   Nitrite, UA Negative Negative  Vitamin D (25 hydroxy)  Result Value Ref Range   Vit D, 25-Hydroxy 31.0 30.0 - 100.0 ng/mL      Assessment & Plan:   Problem List Items Addressed This Visit      Other   Vitamin D deficiency    On OTC supplementation. Will recheck levels today      Relevant Orders   Vitamin D (25 hydroxy) (Completed)   Depression    Increase zoloft to 100 mg. Pt considering counseling.      Relevant Medications   sertraline (ZOLOFT) 100 MG tablet   hydrOXYzine (ATARAX/VISTARIL) 25 MG tablet   Chronic anxiety - Primary    Increase  zoloft to 100 mg, add prn hydroxyzine. Recommended counseling, pt will consider      Relevant Medications   sertraline (ZOLOFT) 100 MG tablet   hydrOXYzine (ATARAX/VISTARIL) 25 MG tablet   Cigarette smoker    Wanting to quit in the future, but not now given stress levels. Resources reviewed including free courses at the hospital, medication options such as zyban, chantix, patches, gums. Long term health risks reviewed. Greater than 3 minutes spent in direct counseling today.        Other Visit Diagnoses    Annual physical exam       Relevant Orders   CBC with Differential/Platelet (Completed)   Comprehensive metabolic panel (Completed)   Lipid Panel w/o Chol/HDL Ratio (Completed)   TSH (Completed)   UA/M w/rflx Culture, Routine (Completed)   Needs flu shot       Relevant Orders   Flu Vaccine QUAD 6+ mos PF IM (Fluarix Quad PF) (Completed)   Screening for breast cancer       Relevant Orders   MM DIGITAL SCREENING BILATERAL       Follow up plan: No follow-ups on file.   LABORATORY TESTING:  - Pap smear: due, will schedule with GYN  IMMUNIZATIONS:   - Tdap: Tetanus vaccination status reviewed: last tetanus booster within 10 years. - Influenza: Administered today  SCREENING: -Mammogram: Ordered today   PATIENT COUNSELING:   Advised to take 1 mg of folate supplement per day if capable of pregnancy.   Sexuality: Discussed sexually transmitted diseases, partner selection, use of condoms, avoidance of unintended pregnancy  and contraceptive alternatives.   Advised to avoid cigarette smoking.  I discussed with the patient that most people either abstain from alcohol or drink within safe limits (<=14/week and <=4 drinks/occasion for males, <=7/weeks and <= 3 drinks/occasion for females) and that the risk for alcohol disorders and other health effects rises proportionally with the number of drinks per week and how often a drinker exceeds daily limits.  Discussed  cessation/primary prevention of drug use and availability of treatment for abuse.   Diet: Encouraged to adjust caloric intake to maintain  or achieve ideal body weight, to reduce intake of dietary saturated fat and total fat, to limit sodium intake by avoiding high sodium foods and not adding table salt, and to maintain adequate dietary potassium and calcium preferably from fresh fruits, vegetables, and low-fat dairy products.    stressed the importance of regular exercise  Injury prevention: Discussed safety belts, safety helmets, smoke detector, smoking near bedding or upholstery.   Dental health: Discussed importance of regular tooth brushing, flossing,  and dental visits.    NEXT PREVENTATIVE PHYSICAL DUE IN 1 YEAR. No follow-ups on file.

## 2018-09-21 NOTE — Patient Instructions (Signed)

## 2018-09-22 LAB — LIPID PANEL W/O CHOL/HDL RATIO
CHOLESTEROL TOTAL: 221 mg/dL — AB (ref 100–199)
HDL: 37 mg/dL — AB (ref >39)
LDL CALC: 152 mg/dL — AB (ref 0–99)
Triglycerides: 158 mg/dL — ABNORMAL HIGH (ref 0–149)
VLDL CHOLESTEROL CAL: 32 mg/dL (ref 5–40)

## 2018-09-22 LAB — CBC WITH DIFFERENTIAL/PLATELET
Basophils Absolute: 0.1 10*3/uL (ref 0.0–0.2)
Basos: 0 %
EOS (ABSOLUTE): 0.3 10*3/uL (ref 0.0–0.4)
Eos: 2 %
HEMOGLOBIN: 14.6 g/dL (ref 11.1–15.9)
Hematocrit: 42.9 % (ref 34.0–46.6)
IMMATURE GRANS (ABS): 0 10*3/uL (ref 0.0–0.1)
IMMATURE GRANULOCYTES: 0 %
LYMPHS: 22 %
Lymphocytes Absolute: 3.1 10*3/uL (ref 0.7–3.1)
MCH: 30 pg (ref 26.6–33.0)
MCHC: 34 g/dL (ref 31.5–35.7)
MCV: 88 fL (ref 79–97)
MONOCYTES: 5 %
Monocytes Absolute: 0.7 10*3/uL (ref 0.1–0.9)
Neutrophils Absolute: 9.9 10*3/uL — ABNORMAL HIGH (ref 1.4–7.0)
Neutrophils: 71 %
Platelets: 402 10*3/uL (ref 150–450)
RBC: 4.86 x10E6/uL (ref 3.77–5.28)
RDW: 13 % (ref 12.3–15.4)
WBC: 14 10*3/uL — AB (ref 3.4–10.8)

## 2018-09-22 LAB — COMPREHENSIVE METABOLIC PANEL
ALBUMIN: 4.7 g/dL (ref 3.5–5.5)
ALT: 18 IU/L (ref 0–32)
AST: 18 IU/L (ref 0–40)
Albumin/Globulin Ratio: 2.4 — ABNORMAL HIGH (ref 1.2–2.2)
Alkaline Phosphatase: 105 IU/L (ref 39–117)
BUN / CREAT RATIO: 12 (ref 9–23)
BUN: 8 mg/dL (ref 6–24)
Bilirubin Total: 0.3 mg/dL (ref 0.0–1.2)
CO2: 25 mmol/L (ref 20–29)
CREATININE: 0.67 mg/dL (ref 0.57–1.00)
Calcium: 10.2 mg/dL (ref 8.7–10.2)
Chloride: 101 mmol/L (ref 96–106)
GFR calc non Af Amer: 104 mL/min/{1.73_m2} (ref >59)
GFR, EST AFRICAN AMERICAN: 119 mL/min/{1.73_m2} (ref >59)
Globulin, Total: 2 g/dL (ref 1.5–4.5)
Glucose: 83 mg/dL (ref 65–99)
Potassium: 4.6 mmol/L (ref 3.5–5.2)
Sodium: 139 mmol/L (ref 134–144)
TOTAL PROTEIN: 6.7 g/dL (ref 6.0–8.5)

## 2018-09-22 LAB — VITAMIN D 25 HYDROXY (VIT D DEFICIENCY, FRACTURES): VIT D 25 HYDROXY: 31 ng/mL (ref 30.0–100.0)

## 2018-09-22 LAB — TSH: TSH: 1.05 u[IU]/mL (ref 0.450–4.500)

## 2018-09-24 NOTE — Assessment & Plan Note (Signed)
Increase zoloft to 100 mg, add prn hydroxyzine. Recommended counseling, pt will consider

## 2018-09-24 NOTE — Assessment & Plan Note (Signed)
On OTC supplementation. Will recheck levels today

## 2018-09-24 NOTE — Assessment & Plan Note (Signed)
Increase zoloft to 100 mg. Pt considering counseling.

## 2018-09-24 NOTE — Assessment & Plan Note (Signed)
Wanting to quit in the future, but not now given stress levels. Resources reviewed including free courses at the hospital, medication options such as zyban, chantix, patches, gums. Long term health risks reviewed. Greater than 3 minutes spent in direct counseling today.

## 2018-09-26 ENCOUNTER — Other Ambulatory Visit: Payer: Self-pay | Admitting: Family Medicine

## 2018-09-26 DIAGNOSIS — E785 Hyperlipidemia, unspecified: Secondary | ICD-10-CM | POA: Insufficient documentation

## 2018-09-26 DIAGNOSIS — E782 Mixed hyperlipidemia: Secondary | ICD-10-CM

## 2018-09-26 DIAGNOSIS — D72829 Elevated white blood cell count, unspecified: Secondary | ICD-10-CM

## 2018-10-05 ENCOUNTER — Other Ambulatory Visit: Payer: Self-pay | Admitting: Family Medicine

## 2018-10-05 DIAGNOSIS — Z1231 Encounter for screening mammogram for malignant neoplasm of breast: Secondary | ICD-10-CM

## 2018-10-13 ENCOUNTER — Other Ambulatory Visit: Payer: Self-pay | Admitting: Family Medicine

## 2018-11-11 ENCOUNTER — Ambulatory Visit
Admission: RE | Admit: 2018-11-11 | Discharge: 2018-11-11 | Disposition: A | Discharge status: Home / Self Care | Payer: BLUE CROSS/BLUE SHIELD | Source: Ambulatory Visit | Attending: Family Medicine | Admitting: Family Medicine

## 2018-11-11 DIAGNOSIS — Z1231 Encounter for screening mammogram for malignant neoplasm of breast: Secondary | ICD-10-CM

## 2018-12-23 ENCOUNTER — Encounter: Payer: Self-pay | Admitting: Family Medicine

## 2018-12-23 ENCOUNTER — Ambulatory Visit: Payer: BLUE CROSS/BLUE SHIELD | Admitting: Family Medicine

## 2018-12-23 VITALS — BP 122/82 | HR 65 | Temp 98.4°F | Wt 200.2 lb

## 2018-12-23 DIAGNOSIS — L409 Psoriasis, unspecified: Secondary | ICD-10-CM | POA: Diagnosis not present

## 2018-12-23 DIAGNOSIS — F17210 Nicotine dependence, cigarettes, uncomplicated: Secondary | ICD-10-CM | POA: Diagnosis not present

## 2018-12-23 DIAGNOSIS — R0602 Shortness of breath: Secondary | ICD-10-CM

## 2018-12-23 DIAGNOSIS — F3342 Major depressive disorder, recurrent, in full remission: Secondary | ICD-10-CM

## 2018-12-23 MED ORDER — CLOBETASOL PROPIONATE 0.05 % EX OINT: 1.0000 "application " | TOPICAL_OINTMENT | Freq: Two times a day (BID) | CUTANEOUS | 0 refills | Status: DC

## 2018-12-23 MED ORDER — SERTRALINE HCL 100 MG PO TABS: 100.0000 mg | ORAL_TABLET | Freq: Every day | ORAL | 1 refills | Status: DC

## 2018-12-23 MED ORDER — HYDROXYZINE HCL 25 MG PO TABS: 25.0000 mg | ORAL_TABLET | Freq: Three times a day (TID) | ORAL | 1 refills | Status: DC | PRN

## 2018-12-23 MED ORDER — ALBUTEROL SULFATE HFA 108 (90 BASE) MCG/ACT IN AERS: 2.0000 | INHALATION_SPRAY | Freq: Four times a day (QID) | RESPIRATORY_TRACT | 0 refills | Status: DC | PRN

## 2018-12-23 NOTE — Progress Notes (Signed)
BP 122/82   Pulse 65   Temp 98.4 F (36.9 C) (Oral)   Wt 200 lb 3.2 oz (90.8 kg)   LMP 06/26/2015 (Exact Date)   SpO2 98%   BMI 31.36 kg/m    Subjective:    Patient ID: Bianca Clay, female    DOB: 01/01/1966, 54 y.o.   MRN: [id number]  HPI: Bianca Clay is a 53 y.o. female  Chief Complaint  Patient presents with  . Depression   Here today for 1 month mood f/u after increasing zoloft and adding prn hydroxyzine. Taking zoloft and hydroxyzine in the mornings and also taking the hydroxyzine in the afternoon around 2 pm. Notes significant improvement in overall mood and good anxiety control with the prn. Son even notes great improvement in mood. Denies side effects, sleep or appetite issues, SI/HI.   Has had some hacking cough and shortness of breath the past few weeks and had relief with her son's albuterol. No known pulm dz, current smoker.  30 year smoker, 1 ppd. Concerned about COPD. Ready to quit. No recent illness. Taking mucinex with some relief. No congestion, sorethroat, fevers.   Having psoriasis flare which is common for her in the winter. Currently has a large plaque on right elbow. Using moisturizers with no relief.   Past Medical History:  Diagnosis Date  . Anxiety   . Cervical dysplasia   . Depression   . Galactorrhea not associated with childbirth 2011   DR. BALL  . History of mammogram 09/19/2013   BIRAD I  . History of Papanicolaou smear of cervix 03/29/2012   -/-   Social History   Socioeconomic History  . Marital status: Single    Spouse name: Not on file  . Number of children: Not on file  . Years of education: Not on file  . Highest education level: Not on file  Occupational History  . Not on file  Social Needs  . Financial resource strain: Not on file  . Food insecurity:    Worry: Not on file    Inability: Not on file  . Transportation needs:    Medical: Not on file    Non-medical: Not on file  Tobacco Use  . Smoking status:  Current Every Day Smoker    Packs/day: 1.00    Types: Cigarettes  . Smokeless tobacco: Never Used  . Tobacco comment: counseling, habit replacement  Substance and Sexual Activity  . Alcohol use: No    Alcohol/week: 0.0 standard drinks  . Drug use: No  . Sexual activity: Not Currently    Birth control/protection: Other-see comments    Comment: TUBAL LIGATION  Lifestyle  . Physical activity:    Days per week: Not on file    Minutes per session: Not on file  . Stress: Not on file  Relationships  . Social connections:    Talks on phone: Not on file    Gets together: Not on file    Attends religious service: Not on file    Active member of club or organization: Not on file    Attends meetings of clubs or organizations: Not on file    Relationship status: Not on file  . Intimate partner violence:    Fear of current or ex partner: Not on file    Emotionally abused: Not on file    Physically abused: Not on file    Forced sexual activity: Not on file  Other Topics Concern  . Not on file  Social History Narrative  . Not on file   Depression screen [facility] 2/9 09/21/2018 08/05/2018 08/12/2015  Decreased Interest 1 1 0  Down, Depressed, Hopeless 1 0 1  PHQ - 2 Score 2 1 1   Altered sleeping 1 1 1   Tired, decreased energy 0 1 1  Change in appetite 1 1 1   Feeling bad or failure about yourself  0 0 0  Trouble concentrating 0 0 0  Moving slowly or fidgety/restless 1 0 0  Suicidal thoughts 0 0 0  PHQ-9 Score 5 4 4    Relevant past medical, surgical, family and social history reviewed and updated as indicated. Interim medical history since our last visit reviewed. Allergies and medications reviewed and updated.  Review of Systems  Per HPI unless specifically indicated above     Objective:    BP 122/82   Pulse 65   Temp 98.4 F (36.9 C) (Oral)   Wt 200 lb 3.2 oz (90.8 kg)   LMP 06/26/2015 (Exact Date)   SpO2 98%   BMI 31.36 kg/m   Wt Readings from Last 3 Encounters:  12/23/18  200 lb 3.2 oz (90.8 kg)  09/21/18 196 lb (88.9 kg)  08/05/18 199 lb 6.4 oz (90.4 kg)    Physical Exam Vitals signs and nursing note reviewed.  Constitutional:      Appearance: Normal appearance. She is not ill-appearing.  HENT:     Head: Atraumatic.  Eyes:     Extraocular Movements: Extraocular movements intact.     Conjunctiva/sclera: Conjunctivae normal.  Neck:     Musculoskeletal: Normal range of motion and neck supple.  Cardiovascular:     Rate and Rhythm: Normal rate and regular rhythm.     Heart sounds: Normal heart sounds.  Pulmonary:     Effort: Pulmonary effort is normal.     Breath sounds: Normal breath sounds. No wheezing or rales.  Musculoskeletal: Normal range of motion.  Skin:    General: Skin is warm and dry.  Neurological:     Mental Status: She is alert and oriented to person, place, and time.  Psychiatric:        Mood and Affect: Mood normal.        Thought Content: Thought content normal.        Judgment: Judgment normal.    Results for orders placed or performed in visit on 09/21/18  CBC with Differential/Platelet  Result Value Ref Range   WBC 14.0 (H) 3.4 - 10.8 x10E3/uL   RBC 4.86 3.77 - 5.28 x10E6/uL   Hemoglobin 14.6 11.1 - 15.9 g/dL   Hematocrit 42.9 34.0 - 46.6 %   MCV 88 79 - 97 fL   MCH 30.0 26.6 - 33.0 pg   MCHC 34.0 31.5 - 35.7 g/dL   RDW 13.0 12.3 - 15.4 %   Platelets 402 150 - 450 x10E3/uL   Neutrophils 71 Not Estab. %   Lymphs 22 Not Estab. %   Monocytes 5 Not Estab. %   Eos 2 Not Estab. %   Basos 0 Not Estab. %   Neutrophils Absolute 9.9 (H) 1.4 - 7.0 x10E3/uL   Lymphocytes Absolute 3.1 0.7 - 3.1 x10E3/uL   Monocytes Absolute 0.7 0.1 - 0.9 x10E3/uL   EOS (ABSOLUTE) 0.3 0.0 - 0.4 x10E3/uL   Basophils Absolute 0.1 0.0 - 0.2 x10E3/uL   Immature Granulocytes 0 Not Estab. %   Immature Grans (Abs) 0.0 0.0 - 0.1 x10E3/uL  Comprehensive metabolic panel  Result Value Ref Range  Glucose 83 65 - 99 mg/dL   BUN 8 6 - 24 mg/dL    Creatinine, Ser 0.67 0.57 - 1.00 mg/dL   GFR calc non Af Amer 104 >59 mL/min/1.73   GFR calc Af Amer 119 >59 mL/min/1.73   BUN/Creatinine Ratio 12 9 - 23   Sodium 139 134 - 144 mmol/L   Potassium 4.6 3.5 - 5.2 mmol/L   Chloride 101 96 - 106 mmol/L   CO2 25 20 - 29 mmol/L   Calcium 10.2 8.7 - 10.2 mg/dL   Total Protein 6.7 6.0 - 8.5 g/dL   Albumin 4.7 3.5 - 5.5 g/dL   Globulin, Total 2.0 1.5 - 4.5 g/dL   Albumin/Globulin Ratio 2.4 (H) 1.2 - 2.2   Bilirubin Total 0.3 0.0 - 1.2 mg/dL   Alkaline Phosphatase 105 39 - 117 IU/L   AST 18 0 - 40 IU/L   ALT 18 0 - 32 IU/L  Lipid Panel w/o Chol/HDL Ratio  Result Value Ref Range   Cholesterol, Total 221 (H) 100 - 199 mg/dL   Triglycerides 158 (H) 0 - 149 mg/dL   HDL 37 (L) >39 mg/dL   VLDL Cholesterol Cal 32 5 - 40 mg/dL   LDL Calculated 152 (H) 0 - 99 mg/dL  TSH  Result Value Ref Range   TSH 1.050 0.450 - 4.500 uIU/mL  UA/M w/rflx Culture, Routine  Result Value Ref Range   Specific Gravity, UA 1.010 1.005 - 1.030   pH, UA 7.0 5.0 - 7.5   Color, UA Yellow Yellow   Appearance Ur Clear Clear   Leukocytes, UA Negative Negative   Protein, UA Negative Negative/Trace   Glucose, UA Negative Negative   Ketones, UA Negative Negative   RBC, UA Negative Negative   Bilirubin, UA Negative Negative   Urobilinogen, Ur 0.2 0.2 - 1.0 mg/dL   Nitrite, UA Negative Negative  Vitamin D (25 hydroxy)  Result Value Ref Range   Vit D, 25-Hydroxy 31.0 30.0 - 100.0 ng/mL      Assessment & Plan:   Problem List Items Addressed This Visit      Musculoskeletal and Integument   Psoriasis    Clobetasol given to tx as needed. Continue BID moisturizers        Other   Depression - Primary    Significant improvement with zoloft and hydroxyzine. Continue current regimen      Relevant Medications   hydrOXYzine (ATARAX/VISTARIL) 25 MG tablet   sertraline (ZOLOFT) 100 MG tablet   Cigarette smoker    Ready and committed to quitting. Does not want  medications/nicotine replacement. Discussed habit replacement, counseling. Greater than 3 min spent counseling       Other Visit Diagnoses    Shortness of breath       spirometry and exam normal today. Will give inhaler for home use prn, encouraged smoking cessation for long term benefit   Relevant Orders   Spirometry with Graph (Completed)   DG Chest 2 View       Follow up plan: Return in about 6 months (around 06/23/2019) for 6 month f/u.

## 2018-12-25 NOTE — Assessment & Plan Note (Signed)
Significant improvement with zoloft and hydroxyzine. Continue current regimen

## 2018-12-25 NOTE — Assessment & Plan Note (Addendum)
Ready and committed to quitting. Does not want medications/nicotine replacement. Discussed habit replacement, counseling. Greater than 3 min spent counseling

## 2018-12-25 NOTE — Assessment & Plan Note (Signed)
Clobetasol given to tx as needed. Continue BID moisturizers

## 2018-12-28 ENCOUNTER — Ambulatory Visit (INDEPENDENT_AMBULATORY_CARE_PROVIDER_SITE_OTHER): Payer: BLUE CROSS/BLUE SHIELD

## 2018-12-28 ENCOUNTER — Ambulatory Visit
Admission: RE | Admit: 2018-12-28 | Discharge: 2018-12-28 | Disposition: A | Discharge status: Home / Self Care | Payer: BLUE CROSS/BLUE SHIELD | Source: Ambulatory Visit | Attending: Family Medicine | Admitting: Family Medicine

## 2018-12-28 ENCOUNTER — Encounter: Payer: Self-pay | Admitting: Podiatry

## 2018-12-28 ENCOUNTER — Other Ambulatory Visit: Payer: Self-pay | Admitting: Podiatry

## 2018-12-28 ENCOUNTER — Ambulatory Visit: Payer: BLUE CROSS/BLUE SHIELD | Admitting: Podiatry

## 2018-12-28 DIAGNOSIS — M7672 Peroneal tendinitis, left leg: Secondary | ICD-10-CM

## 2018-12-28 DIAGNOSIS — R0602 Shortness of breath: Secondary | ICD-10-CM | POA: Diagnosis not present

## 2018-12-28 DIAGNOSIS — M779 Enthesopathy, unspecified: Secondary | ICD-10-CM | POA: Diagnosis not present

## 2018-12-28 DIAGNOSIS — M79671 Pain in right foot: Secondary | ICD-10-CM

## 2018-12-28 DIAGNOSIS — M79672 Pain in left foot: Principal | ICD-10-CM

## 2018-12-28 DIAGNOSIS — M7670 Peroneal tendinitis, unspecified leg: Secondary | ICD-10-CM

## 2018-12-28 DIAGNOSIS — M7671 Peroneal tendinitis, right leg: Secondary | ICD-10-CM | POA: Diagnosis not present

## 2018-12-28 DIAGNOSIS — M778 Other enthesopathies, not elsewhere classified: Secondary | ICD-10-CM

## 2018-12-28 MED ORDER — MELOXICAM 15 MG PO TABS: 15.0000 mg | ORAL_TABLET | Freq: Every day | ORAL | 1 refills | Status: AC

## 2018-12-28 MED ORDER — METHYLPREDNISOLONE 4 MG PO TBPK: ORAL_TABLET | ORAL | 0 refills | Status: DC

## 2018-12-29 ENCOUNTER — Encounter: Payer: Self-pay | Admitting: Family Medicine

## 2019-01-04 NOTE — Progress Notes (Signed)
   HPI: 53 year old female presenting today with a chief complaint of pain to the lateral aspects and midfoot bilaterally that began about 2-3 months ago. She states the pain is worse in the morning when she first gets up. She has been taking Ibuprofen daily for treatment. Patient is here for further evaluation and treatment.  Past Medical History:  Diagnosis Date  . Anxiety   . Cervical dysplasia   . Depression   . Galactorrhea not associated with childbirth 2011   DR. BALL  . History of mammogram 09/19/2013   BIRAD I  . History of Papanicolaou smear of cervix 03/29/2012   -/-     Physical Exam: General: The patient is alert and oriented x3 in no acute distress.  Dermatology: Skin is warm, dry and supple bilateral lower extremities. Negative for open lesions or macerations.  Vascular: Palpable pedal pulses bilaterally. No edema or erythema noted. Capillary refill within normal limits.  Neurological: Epicritic and protective threshold grossly intact bilaterally.   Musculoskeletal Exam: Pain with palpation to the insertion of the peroneal tendons of the bilateral feet as well as to the midfoot bilaterally. Range of motion within normal limits to all pedal and ankle joints bilateral. Muscle strength 5/5 in all groups bilateral.   Radiographic Exam:  Normal osseous mineralization. Joint spaces preserved. No fracture/dislocation/boney destruction.    Assessment: 1. Midfoot capsulitis bilateral 2. Insertional peroneal tendinitis bilateral    Plan of Care:  1. Patient evaluated. X-Rays reviewed.  2. Injection of 0.5 mLs Celestone Soluspan injected into the insertion of the peroneal tendon sheath of the bilateral feet.  3. Prescription for Medrol Dose Pak provided to patient. 4. Prescription for Meloxicam provided to patient. 5. Continue wearing Hoka shoes and OTC insoles.  6. Return to clinic in 6 weeks.       Edrick Kins, DPM [facility]  Dr. Edrick Kins, DPM    [address], [place] [address]                Office [phone]  Fax [phone]

## 2019-01-15 ENCOUNTER — Other Ambulatory Visit: Payer: Self-pay | Admitting: Family Medicine

## 2019-01-16 NOTE — Telephone Encounter (Signed)
Requested Prescriptions  Pending Prescriptions Disp Refills  . VENTOLIN HFA 108 (90 Base) MCG/ACT inhaler [Pharmacy Med Name: VENTOLIN HFA 90 MCG INHALER] 18 Inhaler 3    Sig: TAKE 2 PUFFS BY MOUTH EVERY 6 HOURS AS NEEDED FOR WHEEZE OR SHORTNESS OF BREATH     Pulmonology:  Beta Agonists Failed - 01/15/2019  9:42 AM      Failed - One inhaler should last at least one month. If the patient is requesting refills earlier, contact the patient to check for uncontrolled symptoms.      Passed - Valid encounter within last 12 months    Recent Outpatient Visits          3 weeks ago Recurrent major depressive disorder, in full remission [facility])   [facility] Volney American, [place]   3 months ago Chronic anxiety   [place], [place], [place]   5 months ago Cough   [facility] Kathrine Haddock, NP   6 months ago Moderate episode of recurrent major depressive disorder [facility])   [facility] Volney [place], [place]   3 years ago [place], [place], DO      Future Appointments            In 5 months Orene Desanctis, Lilia Argue, [place], [place]

## 2019-02-14 ENCOUNTER — Ambulatory Visit: Payer: BLUE CROSS/BLUE SHIELD | Admitting: Podiatry

## 2019-02-14 ENCOUNTER — Other Ambulatory Visit: Payer: Self-pay

## 2019-02-14 ENCOUNTER — Encounter: Payer: Self-pay | Admitting: Podiatry

## 2019-02-14 DIAGNOSIS — M7672 Peroneal tendinitis, left leg: Secondary | ICD-10-CM | POA: Diagnosis not present

## 2019-02-14 DIAGNOSIS — M7671 Peroneal tendinitis, right leg: Secondary | ICD-10-CM

## 2019-02-14 DIAGNOSIS — M779 Enthesopathy, unspecified: Secondary | ICD-10-CM

## 2019-02-14 DIAGNOSIS — M778 Other enthesopathies, not elsewhere classified: Secondary | ICD-10-CM

## 2019-02-16 NOTE — Progress Notes (Signed)
   HPI: 53 year old female presenting today for follow up evaluation of bilateral foot pain. She states the lateral aspect of the feet have improved. She reports the pain in the midfoot has improved as well but is still present and worse by the end of the day. Wearing flip flops helps alleviate the pain while wearing shoes increases it. She has been taking Meloxicam and wearing Hoka shoes and insoles for treatment. Patient is here for further evaluation and treatment.  Past Medical History:  Diagnosis Date  . Anxiety   . Cervical dysplasia   . Depression   . Galactorrhea not associated with childbirth 2011   DR. BALL  . History of mammogram 09/19/2013   BIRAD I  . History of Papanicolaou smear of cervix 03/29/2012   -/-     Physical Exam: General: The patient is alert and oriented x3 in no acute distress.  Dermatology: Skin is warm, dry and supple bilateral lower extremities. Negative for open lesions or macerations.  Vascular: Palpable pedal pulses bilaterally. No edema or erythema noted. Capillary refill within normal limits.  Neurological: Epicritic and protective threshold grossly intact bilaterally.   Musculoskeletal Exam: Pain with palpation noted to the bilateral midfoot. Range of motion within normal limits to all pedal and ankle joints bilateral. Muscle strength 5/5 in all groups bilateral.   Assessment: 1. Midfoot capsulitis bilateral - right greater than left  2. Insertional peroneal tendinitis bilateral - resolved    Plan of Care:  1. Patient evaluated.  2. Injection of 0.5 mLs Celestone Soluspan injected into the right midfoot.  3. Continue taking Meloxicam 15 mg daily.  4. Appointment with Liliane Channel, Pedorthist, for custom molded orthotics.  5. Continue wearing Hoka and OTC insoles at this time.  6. Return to clinic as needed.   Works at [employer] 12 hour shift.      Edrick Kins, DPM [facility]  Dr. Edrick Kins, DPM    [address]  [place], [place] [address]                Office [phone]  Fax [phone]

## 2019-03-08 ENCOUNTER — Other Ambulatory Visit: Payer: BLUE CROSS/BLUE SHIELD | Admitting: Orthotics

## 2019-04-19 ENCOUNTER — Other Ambulatory Visit: Payer: Self-pay

## 2019-04-19 MED ORDER — MELOXICAM 15 MG PO TABS: 15.0000 mg | ORAL_TABLET | Freq: Every day | ORAL | 3 refills | Status: DC

## 2019-04-19 NOTE — Telephone Encounter (Signed)
Pharmacy refill request for Meloxicam 15mg .  Per Dr. Amalia Hailey verbal order, ok to refill.  Script has been sent to pharmacy

## 2019-04-27 ENCOUNTER — Encounter: Payer: Self-pay | Admitting: Family Medicine

## 2019-04-27 ENCOUNTER — Other Ambulatory Visit: Payer: Self-pay

## 2019-04-27 ENCOUNTER — Ambulatory Visit: Payer: BLUE CROSS/BLUE SHIELD | Admitting: Family Medicine

## 2019-04-27 VITALS — BP 120/77 | HR 64 | Temp 98.8°F | Ht 67.0 in | Wt 210.0 lb

## 2019-04-27 DIAGNOSIS — R109 Unspecified abdominal pain: Secondary | ICD-10-CM

## 2019-04-27 DIAGNOSIS — Z1211 Encounter for screening for malignant neoplasm of colon: Secondary | ICD-10-CM | POA: Diagnosis not present

## 2019-04-27 LAB — UA/M W/RFLX CULTURE, ROUTINE
Bilirubin, UA: NEGATIVE
Glucose, UA: NEGATIVE
Ketones, UA: NEGATIVE
Leukocytes,UA: NEGATIVE
Nitrite, UA: NEGATIVE
Protein,UA: NEGATIVE
RBC, UA: NEGATIVE
Specific Gravity, UA: 1.015 (ref 1.005–1.030)
Urobilinogen, Ur: 0.2 mg/dL (ref 0.2–1.0)
pH, UA: 7 (ref 5.0–7.5)

## 2019-04-27 NOTE — Progress Notes (Signed)
BP 120/77   Pulse 64   Temp 98.8 F (37.1 C) (Oral)   Ht 5\' 7"  (1.702 m)   Wt 210 lb (95.3 kg)   LMP 06/26/2015 (Exact Date)   SpO2 95%   BMI 32.89 kg/m    Subjective:    Patient ID: Bianca Clay, female    DOB: 02/10/1966, 53 y.o.   MRN: [id number]  HPI: GUYLA BLESS is a 53 y.o. female  Chief Complaint  Patient presents with  . Back Pain    middle lower back. pain moves to the right side at times. pain started yesterday   Here today for 1 day of right flank, mid back pain. Seems to be worse with certain movements and eases some with stretch and massage. Felt like things also improved some off once she started drinking more water. Drinking lots of coffee and minimal water. Denies hematuria, dysuria, recent injury or heavy lifting, bruising, fever, abdominal pain. Not trying anything OTC for relief.   Relevant past medical, surgical, family and social history reviewed and updated as indicated. Interim medical history since our last visit reviewed. Allergies and medications reviewed and updated.  Review of Systems  Per HPI unless specifically indicated above     Objective:    BP 120/77   Pulse 64   Temp 98.8 F (37.1 C) (Oral)   Ht 5\' 7"  (1.702 m)   Wt 210 lb (95.3 kg)   LMP 06/26/2015 (Exact Date)   SpO2 95%   BMI 32.89 kg/m   Wt Readings from Last 3 Encounters:  04/27/19 210 lb (95.3 kg)  12/23/18 200 lb 3.2 oz (90.8 kg)  09/21/18 196 lb (88.9 kg)    Physical Exam Vitals signs and nursing note reviewed.  Constitutional:      Appearance: Normal appearance. She is not ill-appearing.  HENT:     Head: Atraumatic.  Eyes:     Extraocular Movements: Extraocular movements intact.     Conjunctiva/sclera: Conjunctivae normal.  Neck:     Musculoskeletal: Normal range of motion and neck supple.  Cardiovascular:     Rate and Rhythm: Normal rate and regular rhythm.     Heart sounds: Normal heart sounds.  Pulmonary:     Effort: Pulmonary effort is  normal.     Breath sounds: Normal breath sounds.  Abdominal:     General: Bowel sounds are normal.     Palpations: Abdomen is soft.     Tenderness: There is no abdominal tenderness. There is no right CVA tenderness, left CVA tenderness or guarding.  Musculoskeletal: Normal range of motion.        General: Tenderness (mildly ttp right lateral midback) present.  Skin:    General: Skin is warm and dry.  Neurological:     Mental Status: She is alert and oriented to person, place, and time.  Psychiatric:        Mood and Affect: Mood normal.        Thought Content: Thought content normal.        Judgment: Judgment normal.     Results for orders placed or performed in visit on 04/27/19  UA/M w/rflx Culture, Routine  Result Value Ref Range   Specific Gravity, UA 1.015 1.005 - 1.030   pH, UA 7.0 5.0 - 7.5   Color, UA Yellow Yellow   Appearance Ur Clear Clear   Leukocytes,UA Negative Negative   Protein,UA Negative Negative/Trace   Glucose, UA Negative Negative   Ketones, UA Negative Negative  RBC, UA Negative Negative   Bilirubin, UA Negative Negative   Urobilinogen, Ur 0.2 0.2 - 1.0 mg/dL   Nitrite, UA Negative Negative      Assessment & Plan:   Problem List Items Addressed This Visit    None    Visit Diagnoses    Right flank pain    -  Primary   U/A benign. Suspect muscular. Heat, NSAIDs, massage, stretches. Return precautions reviewed   Relevant Orders   UA/M w/rflx Culture, Routine (Completed)   Colon cancer screening       Relevant Orders   Ambulatory referral to Gastroenterology       Follow up plan: Return if symptoms worsen or fail to improve.

## 2019-06-23 ENCOUNTER — Ambulatory Visit: Payer: BLUE CROSS/BLUE SHIELD | Admitting: Family Medicine

## 2019-06-24 ENCOUNTER — Other Ambulatory Visit: Payer: Self-pay | Admitting: Family Medicine

## 2019-06-24 NOTE — Telephone Encounter (Signed)
Requested Prescriptions  Pending Prescriptions Disp Refills  . sertraline (ZOLOFT) 100 MG tablet [Pharmacy Med Name: SERTRALINE HCL 100 MG TABLET] 90 tablet 1    Sig: TAKE 1 TABLET BY MOUTH EVERY DAY     Psychiatry:  Antidepressants - SSRI Passed - 06/24/2019  9:20 AM      Passed - Valid encounter within last 6 months    Recent Outpatient Visits          1 month ago Right flank pain   [facility] Merrie Roof [place], [place]   6 months ago Recurrent major depressive disorder, in full remission [facility])   [place], [place], [place]   9 months ago Chronic anxiety   [place], [place], [place]   10 months ago Cough   [facility] Kathrine Haddock, NP   11 months ago Moderate episode of recurrent major depressive disorder [facility])   [place], [place], [place]      Future Appointments            In 4 days Orene Desanctis, Lilia Argue, PA-C [place], [place] - Completed PHQ-2 or PHQ-9 in the last 360 days.      . hydrOXYzine (ATARAX/VISTARIL) 25 MG tablet [Pharmacy Med Name: HYDROXYZINE HCL 25 MG TABLET] 270 tablet 1    Sig: TAKE 1 TABLET BY MOUTH THREE TIMES A DAY AS NEEDED     Ear, Nose, and Throat:  Antihistamines Passed - 06/24/2019  9:20 AM      Passed - Valid encounter within last 12 months    Recent Outpatient Visits          1 month ago Right flank pain   [place], [place], [place]   6 months ago Recurrent major depressive disorder, in full remission [facility])   [place], [place], [place]   9 months ago Chronic anxiety   [facility] Merrie Roof [place], [place]   10 months ago Cough   [facility] Kathrine Haddock, NP   11 months ago Moderate episode of recurrent major depressive disorder [facility])   [facility] Volney American, [place]      Future Appointments             In 4 days Orene Desanctis, Lilia Argue, PA-C [facility], [place]

## 2019-06-26 ENCOUNTER — Encounter: Payer: Self-pay | Admitting: *Deleted

## 2019-06-28 ENCOUNTER — Ambulatory Visit (INDEPENDENT_AMBULATORY_CARE_PROVIDER_SITE_OTHER): Payer: BLUE CROSS/BLUE SHIELD | Admitting: Family Medicine

## 2019-06-28 ENCOUNTER — Encounter: Payer: Self-pay | Admitting: Family Medicine

## 2019-06-28 ENCOUNTER — Other Ambulatory Visit: Payer: Self-pay

## 2019-06-28 VITALS — BP 104/71 | HR 66 | Temp 98.0°F | Ht 68.0 in | Wt 210.0 lb

## 2019-06-28 DIAGNOSIS — F419 Anxiety disorder, unspecified: Secondary | ICD-10-CM | POA: Diagnosis not present

## 2019-06-28 DIAGNOSIS — E782 Mixed hyperlipidemia: Secondary | ICD-10-CM | POA: Diagnosis not present

## 2019-06-28 DIAGNOSIS — F3342 Major depressive disorder, recurrent, in full remission: Secondary | ICD-10-CM

## 2019-06-28 DIAGNOSIS — L409 Psoriasis, unspecified: Secondary | ICD-10-CM

## 2019-06-28 DIAGNOSIS — F17210 Nicotine dependence, cigarettes, uncomplicated: Secondary | ICD-10-CM

## 2019-06-28 NOTE — Assessment & Plan Note (Signed)
Under good control, continue current regimen. Will refer to Psychiatry to evaluate for emotional support dog

## 2019-06-28 NOTE — Assessment & Plan Note (Signed)
Stable and under good control, continue current regimen 

## 2019-06-28 NOTE — Assessment & Plan Note (Signed)
Has been unable to cut back with the increased stress of things, continued efforts reviewed. Patient will let us know when she is committed to quitting

## 2019-06-28 NOTE — Assessment & Plan Note (Signed)
Declines lab recheck today, will work on diet and exercise and add fish oil supplements and recheck at upcoming CPE

## 2019-06-28 NOTE — Progress Notes (Signed)
BP 104/71 (BP Location: Left Arm, Patient Position: Sitting, Cuff Size: Normal)   Pulse 66   Temp 98 F (36.7 C) (Oral)   Ht 5\' 8"  (1.727 m)   Wt 210 lb (95.3 kg)   LMP 06/26/2015 (Exact Date)   SpO2 95%   BMI 31.93 kg/m    Subjective:    Patient ID: Bianca Clay, female    DOB: 09-17-1966, 53 y.o.   MRN: [id number]  HPI: [name] is a 53 y.o. female  Chief Complaint  Patient presents with  . Depression    6 month F/U. Patient states no complaints.  . Psoriasis  . Nicotine Dependence   Patient here today for 6 month f/u.   Feels her mood medicine is going great, just agitated due to being so short staffed at work and worried about all of the things going on in the world. Takes zoloft daily and hydroxyzine twice daily on average for breakthrough anxiety. Denies SI/HI, significant mood swings, sleep or appetite changes. Does want to discuss the possibility of having an emotional support animal certification so she could take her dog along with her to help keep her calm wherever she goes.   Not taking her clobetasol regularly for her psoriasis of both elbows. States it doesn't bother her much though the plaques stay there basically all the time. The ointment does help when she takes it. Does not regularly moisturize skin.   Unable to cut back on cigarettes right now due to stress levels.   Cholesterol was elevated on last check, pt wishes to hold off rechecking it at this time because she has not changed her lifestyle habits due to all the stress she's under.   Depression screen [facility] 2/9 06/28/2019 09/21/2018 08/05/2018  Decreased Interest 0 1 1  Down, Depressed, Hopeless 0 1 0  PHQ - 2 Score 0 2 1  Altered sleeping 1 1 1   Tired, decreased energy 1 0 1  Change in appetite 0 1 1  Feeling bad or failure about yourself  0 0 0  Trouble concentrating 0 0 0  Moving slowly or fidgety/restless 0 1 0  Suicidal thoughts 0 0 0  PHQ-9 Score 2 5 4    GAD 7 : Generalized  Anxiety Score 06/28/2019 09/21/2018  Nervous, Anxious, on Edge 2 2  Control/stop worrying 1 1  Worry too much - different things 1 1  Trouble relaxing 1 2  Restless 0 1  Easily annoyed or irritable 1 2  Afraid - awful might happen 0 0  Total GAD 7 Score 6 9  Anxiety Difficulty Somewhat difficult Not difficult at all     Relevant past medical, surgical, family and social history reviewed and updated as indicated. Interim medical history since our last visit reviewed. Allergies and medications reviewed and updated.  Review of Systems  Per HPI unless specifically indicated above     Objective:    BP 104/71 (BP Location: Left Arm, Patient Position: Sitting, Cuff Size: Normal)   Pulse 66   Temp 98 F (36.7 C) (Oral)   Ht 5\' 8"  (1.727 m)   Wt 210 lb (95.3 kg)   LMP 06/26/2015 (Exact Date)   SpO2 95%   BMI 31.93 kg/m   Wt Readings from Last 3 Encounters:  06/28/19 210 lb (95.3 kg)  04/27/19 210 lb (95.3 kg)  12/23/18 200 lb 3.2 oz (90.8 kg)    Physical Exam Vitals signs and nursing note reviewed.  Constitutional:  Appearance: Normal appearance. She is not ill-appearing.  HENT:     Head: Atraumatic.  Eyes:     Extraocular Movements: Extraocular movements intact.     Conjunctiva/sclera: Conjunctivae normal.  Neck:     Musculoskeletal: Normal range of motion and neck supple.  Cardiovascular:     Rate and Rhythm: Normal rate and regular rhythm.     Heart sounds: Normal heart sounds.  Pulmonary:     Effort: Pulmonary effort is normal.     Breath sounds: Normal breath sounds.  Musculoskeletal: Normal range of motion.  Skin:    General: Skin is warm and dry.     Findings: Rash (Psoriasis plaques present b/l elbows) present.  Neurological:     Mental Status: She is alert and oriented to person, place, and time.  Psychiatric:        Mood and Affect: Mood normal.        Thought Content: Thought content normal.        Judgment: Judgment normal.     Results for  orders placed or performed in visit on 04/27/19  UA/M w/rflx Culture, Routine   Specimen: Urine   URINE  Result Value Ref Range   Specific Gravity, UA 1.015 1.005 - 1.030   pH, UA 7.0 5.0 - 7.5   Color, UA Yellow Yellow   Appearance Ur Clear Clear   Leukocytes,UA Negative Negative   Protein,UA Negative Negative/Trace   Glucose, UA Negative Negative   Ketones, UA Negative Negative   RBC, UA Negative Negative   Bilirubin, UA Negative Negative   Urobilinogen, Ur 0.2 0.2 - 1.0 mg/dL   Nitrite, UA Negative Negative      Assessment & Plan:   Problem List Items Addressed This Visit      Musculoskeletal and Integument   Psoriasis    Discussed taking the medication consistently for flares, and having a good moisturizer routine twice daily         Other   Depression    Stable and under good control, continue current regimen      Chronic anxiety - Primary    Under good control, continue current regimen. Will refer to Psychiatry to evaluate for emotional support dog      Relevant Orders   Ambulatory referral to Psychiatry   Cigarette smoker    Has been unable to cut back with the increased stress of things, continued efforts reviewed. Patient will let us know when she is committed to quitting      Hyperlipidemia    Declines lab recheck today, will work on diet and exercise and add fish oil supplements and recheck at upcoming CPE          Follow up plan: Return for CPE.

## 2019-06-28 NOTE — Assessment & Plan Note (Signed)
Discussed taking the medication consistently for flares, and having a good moisturizer routine twice daily

## 2019-06-29 ENCOUNTER — Telehealth: Payer: Self-pay | Admitting: Gastroenterology

## 2019-06-29 NOTE — Telephone Encounter (Signed)
Pt received a letter to schedule colonoscopy

## 2019-06-30 ENCOUNTER — Telehealth: Payer: Self-pay

## 2019-06-30 NOTE — Telephone Encounter (Signed)
Bianca Clay returned patients call to schedule patientscolonoscopy yesterday, however patient does not have anyone to stay with her during the procedure and will call back.  This note was documented in patients referral yesterday.  Thanks [employer]

## 2019-07-12 ENCOUNTER — Other Ambulatory Visit: Payer: Self-pay

## 2019-07-12 ENCOUNTER — Ambulatory Visit (INDEPENDENT_AMBULATORY_CARE_PROVIDER_SITE_OTHER): Payer: Self-pay | Admitting: Orthotics

## 2019-07-12 DIAGNOSIS — M7672 Peroneal tendinitis, left leg: Secondary | ICD-10-CM

## 2019-07-12 DIAGNOSIS — M7671 Peroneal tendinitis, right leg: Secondary | ICD-10-CM

## 2019-07-12 DIAGNOSIS — M778 Other enthesopathies, not elsewhere classified: Secondary | ICD-10-CM

## 2019-07-12 DIAGNOSIS — M779 Enthesopathy, unspecified: Secondary | ICD-10-CM

## 2019-07-12 NOTE — Progress Notes (Signed)
Patient came into today to be cast for Custom Foot Orthotics. Upon recommendation of Dr. 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

## 2019-07-17 ENCOUNTER — Other Ambulatory Visit: Payer: Self-pay

## 2019-07-17 ENCOUNTER — Ambulatory Visit: Payer: BLUE CROSS/BLUE SHIELD | Admitting: Podiatry

## 2019-07-17 ENCOUNTER — Encounter: Payer: Self-pay | Admitting: Podiatry

## 2019-07-17 DIAGNOSIS — M779 Enthesopathy, unspecified: Secondary | ICD-10-CM

## 2019-07-17 DIAGNOSIS — M778 Other enthesopathies, not elsewhere classified: Secondary | ICD-10-CM

## 2019-07-17 DIAGNOSIS — M7672 Peroneal tendinitis, left leg: Secondary | ICD-10-CM | POA: Diagnosis not present

## 2019-07-17 DIAGNOSIS — M7671 Peroneal tendinitis, right leg: Secondary | ICD-10-CM

## 2019-07-17 NOTE — Progress Notes (Signed)
This patient presents the office with severe pain noted in both feet she states that she is having pain on the top of both feet as well as on the outside midfoot left foot.  Patient states that this pain has been going on for a few months.  She states that she is waiting for orthoses to arrive and she will start wearing the orthoses in early September.. In the meantime she presents the office today for an evaluation and treatment of her painful feet. She does admit to taking Mobic daily.    Vascular  Dorsalis pedis and posterior tibial pulses are palpable  B/L.  Capillary return  WNL.  Temperature gradient is  WNL.  Skin turgor  WNL  Sensorium  Senn Weinstein monofilament wire  WNL. Normal tactile sensation.  Nail Exam  Patient has normal nails with no evidence of bacterial or fungal infection.  Orthopedic  Exam  Muscle tone and muscle strength  WNL.  No limitations of motion feet  B/L.  No crepitus or joint effusion noted.  Foot type is unremarkable and digits show no abnormalities. 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  Palpable pain noted at the insertion of the peroneal tendon left foot.  Palpable pain noted at the Lisfranc's joint bilaterally.  Skin  No open lesions.  Normal skin texture and turgor.  Midfoot Capsulitis  B/L.  Insertional peroneal tendinits left foot.  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  Discussed this condition with this patient.  Told her that she has a severe hallux limitus big toe joint both feet which led to her painful tendinitis both feet.  Discussed taking a Medrol Dosepak by mouth but she was not interested.  She said she desires injection therapy in both feet.  Injection therapy using 1.0 cc. Of 2% xylocaine( 20 mg.) plus 1 cc. of kenalog-la ( 10 mg) plus 1/2 cc. of dexamethazone phosphate ( 2 mg) both feet.   Patient was also prescribed power step insoles to be worn in her shoes until her orthoses arrive.  Return to the clinic as needed   Gardiner Barefoot DPM

## 2019-08-02 ENCOUNTER — Other Ambulatory Visit: Payer: Self-pay

## 2019-08-02 ENCOUNTER — Ambulatory Visit: Payer: BLUE CROSS/BLUE SHIELD | Admitting: Orthotics

## 2019-08-02 DIAGNOSIS — M7671 Peroneal tendinitis, right leg: Secondary | ICD-10-CM

## 2019-08-02 DIAGNOSIS — M778 Other enthesopathies, not elsewhere classified: Secondary | ICD-10-CM

## 2019-08-02 DIAGNOSIS — M7672 Peroneal tendinitis, left leg: Secondary | ICD-10-CM

## 2019-08-03 NOTE — Progress Notes (Signed)
Patient came in today to pick up custom made foot orthotics.  The goals were accomplished and the patient reported no dissatisfaction with said orthotics.  Patient was advised of breakin period and how to report any issues. 

## 2019-08-23 ENCOUNTER — Encounter: Payer: BLUE CROSS/BLUE SHIELD | Admitting: Family Medicine

## 2019-08-28 ENCOUNTER — Other Ambulatory Visit: Payer: Self-pay | Admitting: Podiatry

## 2019-09-25 ENCOUNTER — Encounter: Payer: BLUE CROSS/BLUE SHIELD | Admitting: Family Medicine

## 2019-10-16 ENCOUNTER — Encounter: Payer: BLUE CROSS/BLUE SHIELD | Admitting: Family Medicine

## 2019-10-17 ENCOUNTER — Other Ambulatory Visit: Payer: Self-pay

## 2019-10-18 ENCOUNTER — Encounter: Payer: Self-pay | Admitting: Family Medicine

## 2019-10-18 ENCOUNTER — Other Ambulatory Visit: Payer: Self-pay

## 2019-10-18 ENCOUNTER — Ambulatory Visit (INDEPENDENT_AMBULATORY_CARE_PROVIDER_SITE_OTHER): Payer: BLUE CROSS/BLUE SHIELD | Admitting: Family Medicine

## 2019-10-18 VITALS — BP 126/83 | HR 76 | Temp 98.7°F | Ht 66.0 in | Wt 220.0 lb

## 2019-10-18 DIAGNOSIS — Z114 Encounter for screening for human immunodeficiency virus [HIV]: Secondary | ICD-10-CM

## 2019-10-18 DIAGNOSIS — Z1231 Encounter for screening mammogram for malignant neoplasm of breast: Secondary | ICD-10-CM | POA: Diagnosis not present

## 2019-10-18 DIAGNOSIS — F3342 Major depressive disorder, recurrent, in full remission: Secondary | ICD-10-CM

## 2019-10-18 DIAGNOSIS — Z124 Encounter for screening for malignant neoplasm of cervix: Secondary | ICD-10-CM

## 2019-10-18 DIAGNOSIS — Z1211 Encounter for screening for malignant neoplasm of colon: Secondary | ICD-10-CM | POA: Diagnosis not present

## 2019-10-18 DIAGNOSIS — Z6835 Body mass index (BMI) 35.0-35.9, adult: Secondary | ICD-10-CM

## 2019-10-18 DIAGNOSIS — Z0000 Encounter for general adult medical examination without abnormal findings: Secondary | ICD-10-CM

## 2019-10-18 DIAGNOSIS — E782 Mixed hyperlipidemia: Secondary | ICD-10-CM

## 2019-10-18 DIAGNOSIS — J3089 Other allergic rhinitis: Secondary | ICD-10-CM

## 2019-10-18 DIAGNOSIS — E669 Obesity, unspecified: Secondary | ICD-10-CM | POA: Insufficient documentation

## 2019-10-18 DIAGNOSIS — F419 Anxiety disorder, unspecified: Secondary | ICD-10-CM

## 2019-10-18 DIAGNOSIS — L409 Psoriasis, unspecified: Secondary | ICD-10-CM

## 2019-10-18 DIAGNOSIS — E559 Vitamin D deficiency, unspecified: Secondary | ICD-10-CM

## 2019-10-18 DIAGNOSIS — M79672 Pain in left foot: Secondary | ICD-10-CM

## 2019-10-18 DIAGNOSIS — M79671 Pain in right foot: Secondary | ICD-10-CM

## 2019-10-18 LAB — UA/M W/RFLX CULTURE, ROUTINE
Bilirubin, UA: NEGATIVE
Glucose, UA: NEGATIVE
Leukocytes,UA: NEGATIVE
Nitrite, UA: NEGATIVE
Protein,UA: NEGATIVE
RBC, UA: NEGATIVE
Specific Gravity, UA: 1.025 (ref 1.005–1.030)
Urobilinogen, Ur: 0.2 mg/dL (ref 0.2–1.0)
pH, UA: 5.5 (ref 5.0–7.5)

## 2019-10-18 MED ORDER — DICLOFENAC SODIUM 1 % EX GEL: 2.0000 g | Freq: Four times a day (QID) | CUTANEOUS | 2 refills | Status: DC

## 2019-10-18 NOTE — Progress Notes (Signed)
BP 126/83   Pulse 76   Temp 98.7 F (37.1 C) (Oral)   Ht 5\' 6"  (1.676 m)   Wt 220 lb (99.8 kg)   LMP 06/26/2015 (Exact Date)   SpO2 96%   BMI 35.51 kg/m    Subjective:    Patient ID: Bianca Clay, female    DOB: 03-Sep-1966, 53 y.o.   MRN: [id number]  HPI: Bianca Clay is a 53 y.o. female presenting on 10/18/2019 for comprehensive medical examination. Current medical complaints include:see below  Getting cortisone shots through Podiatry, meloxicam no longer helping. Wanting to discuss other conservative options.   Anxiety and depression - feeling very well with the zoloft and hydroxyzine prn. Work seems to be her biggest trigger, and notes she's learned that she can't change or control some things so she just doesn't let them get to her anymore. Denies si/hi, side effects, crying spells.   Vit D deficiency, on OTC vit D, asymptomatic. Trying to eat a healthy diet  Frustrated by her weight. States she only eats 1-2 small meals per day and stays active with work but that her weight continues to go up. Has known mildly elevated cholesterol, currently diet controlled. Unsure what she should and shouldn't be eating to help control this and her weight.   Psoriasis under good control with prn clobetasol.   She currently lives with: Menopausal Symptoms: no  Depression Screen done today and results listed below:  Depression screen [facility] 2/9 10/18/2019 06/28/2019 09/21/2018 08/05/2018 08/12/2015  Decreased Interest 1 0 1 1 0  Down, Depressed, Hopeless 0 0 1 0 1  PHQ - 2 Score 1 0 2 1 1   Altered sleeping 2 1 1 1 1   Tired, decreased energy 1 1 0 1 1  Change in appetite 1 0 1 1 1   Feeling bad or failure about yourself  0 0 0 0 0  Trouble concentrating 0 0 0 0 0  Moving slowly or fidgety/restless 0 0 1 0 0  Suicidal thoughts 0 0 0 0 0  PHQ-9 Score 5 2 5 4 4     The patient does not have a history of falls. I did complete a risk assessment for falls. A plan of care for falls was  documented.   Past Medical History:  Past Medical History:  Diagnosis Date  . Anxiety   . Cervical dysplasia   . Depression   . Galactorrhea not associated with childbirth 2011   DR. BALL  . History of mammogram 09/19/2013   BIRAD I  . History of Papanicolaou smear of cervix 03/29/2012   -/-    Surgical History:  Past Surgical History:  Procedure Laterality Date  . CERVICAL CONE BIOPSY  1989  . CESAREAN SECTION     X1  . HYSTEROSCOPY    . TONSILLECTOMY  1980  . TUBAL LIGATION      Medications:  Current Outpatient Medications on File Prior to Visit  Medication Sig  . Cholecalciferol (VITAMIN D PO) Take 2,000 Units by mouth daily.   . clobetasol ointment (TEMOVATE) [id number] % Apply 1 application topically 2 (two) times daily.  . hydrOXYzine (ATARAX/VISTARIL) 25 MG tablet TAKE 1 TABLET BY MOUTH THREE TIMES A DAY AS NEEDED  . meloxicam (MOBIC) 15 MG tablet TAKE 1 TABLET BY MOUTH EVERY DAY  . Multiple Vitamin (MULTIVITAMIN) capsule Take 1 capsule by mouth daily.  . sertraline (ZOLOFT) 100 MG tablet TAKE 1 TABLET BY MOUTH EVERY DAY  . VENTOLIN HFA 108 (90  Base) MCG/ACT inhaler TAKE 2 PUFFS BY MOUTH EVERY 6 HOURS AS NEEDED FOR WHEEZE OR SHORTNESS OF BREATH  . vitamin B-12 (CYANOCOBALAMIN) 1000 MCG tablet Take 1,000 mcg by mouth daily.  . vitamin C (ASCORBIC ACID) 500 MG tablet Take 500 mg by mouth daily.   No current facility-administered medications on file prior to visit.     Allergies:  No Known Allergies  Social History:  Social History   Socioeconomic History  . Marital status: Single    Spouse name: Not on file  . Number of children: Not on file  . Years of education: Not on file  . Highest education level: Not on file  Occupational History  . Not on file  Social Needs  . Financial resource strain: Not on file  . Food insecurity    Worry: Not on file    Inability: Not on file  . Transportation needs    Medical: Not on file    Non-medical: Not on file   Tobacco Use  . Smoking status: Current Every Day Smoker    Packs/day: 1.00    Types: Cigarettes  . Smokeless tobacco: Never Used  . Tobacco comment: counseling, habit replacement  Substance and Sexual Activity  . Alcohol use: No    Alcohol/week: 0.0 standard drinks  . Drug use: No  . Sexual activity: Not Currently    Birth control/protection: Other-see comments    Comment: TUBAL LIGATION  Lifestyle  . Physical activity    Days per week: Not on file    Minutes per session: Not on file  . Stress: Not on file  Relationships  . Social Herbalist on phone: Not on file    Gets together: Not on file    Attends religious service: Not on file    Active member of club or organization: Not on file    Attends meetings of clubs or organizations: Not on file    Relationship status: Not on file  . Intimate partner violence    Fear of current or ex partner: Not on file    Emotionally abused: Not on file    Physically abused: Not on file    Forced sexual activity: Not on file  Other Topics Concern  . Not on file  Social History Narrative  . Not on file   Social History   Tobacco Use  Smoking Status Current Every Day Smoker  . Packs/day: 1.00  . Types: Cigarettes  Smokeless Tobacco Never Used  Tobacco Comment   counseling, habit replacement   Social History   Substance and Sexual Activity  Alcohol Use No  . Alcohol/week: 0.0 standard drinks    Family History:  Family History  Problem Relation Age of Onset  . Diabetes Mother   . Hypertension Mother   . Diabetes Father   . Hypertension Father   . Depression Sister   . Hypertension Sister   . Allergies Son   . Depression Sister   . Breast cancer Neg Hx     Past medical history, surgical history, medications, allergies, family history and social history reviewed with patient today and changes made to appropriate areas of the chart.   Review of Systems - General ROS: negative Psychological ROS: negative  Ophthalmic ROS: negative ENT ROS: negative Allergy and Immunology ROS: negative Hematological and Lymphatic ROS: negative Endocrine ROS: negative Breast ROS: negative for breast lumps Respiratory ROS: no cough, shortness of breath, or wheezing Cardiovascular ROS: no chest pain or dyspnea on  exertion Gastrointestinal ROS: no abdominal pain, change in bowel habits, or black or bloody stools Genito-Urinary ROS: no dysuria, trouble voiding, or hematuria Musculoskeletal ROS: positive for - joint pain Neurological ROS: no TIA or stroke symptoms Dermatological ROS: negative All other ROS negative except what is listed above and in the HPI.      Objective:    BP 126/83   Pulse 76   Temp 98.7 F (37.1 C) (Oral)   Ht 5\' 6"  (1.676 m)   Wt 220 lb (99.8 kg)   LMP 06/26/2015 (Exact Date)   SpO2 96%   BMI 35.51 kg/m   Wt Readings from Last 3 Encounters:  10/18/19 220 lb (99.8 kg)  06/28/19 210 lb (95.3 kg)  04/27/19 210 lb (95.3 kg)    Physical Exam Vitals signs and nursing note reviewed.  Constitutional:      General: She is not in acute distress.    Appearance: She is well-developed.  HENT:     Head: Atraumatic.     Right Ear: External ear normal.     Left Ear: External ear normal.     Nose: Nose normal.     Mouth/Throat:     Pharynx: No oropharyngeal exudate.  Eyes:     General: No scleral icterus.    Conjunctiva/sclera: Conjunctivae normal.     Pupils: Pupils are equal, round, and reactive to light.  Neck:     Musculoskeletal: Normal range of motion and neck supple.     Thyroid: No thyromegaly.  Cardiovascular:     Rate and Rhythm: Normal rate and regular rhythm.     Heart sounds: Normal heart sounds.  Pulmonary:     Effort: Pulmonary effort is normal. No respiratory distress.     Breath sounds: Normal breath sounds.  Chest:     Breasts:        Right: No mass, skin change or tenderness.        Left: No mass, skin change or tenderness.  Abdominal:     General:  Bowel sounds are normal.     Palpations: Abdomen is soft. There is no mass.     Tenderness: There is no abdominal tenderness.  Genitourinary:    General: Normal vulva.     Vagina: No vaginal discharge.  Musculoskeletal: Normal range of motion.        General: No tenderness.  Lymphadenopathy:     Cervical: No cervical adenopathy.     Upper Body:     Right upper body: No axillary adenopathy.     Left upper body: No axillary adenopathy.  Skin:    General: Skin is warm and dry.     Findings: No rash.  Neurological:     Mental Status: She is alert and oriented to person, place, and time.     Cranial Nerves: No cranial nerve deficit.  Psychiatric:        Behavior: Behavior normal.     Results for orders placed or performed in visit on 10/18/19  HIV antibody (with reflex)  Result Value Ref Range   HIV Screen 4th Generation wRfx Non Reactive Non Reactive  CBC with Differential/Platelet out  Result Value Ref Range   WBC 10.5 3.4 - 10.8 x10E3/uL   RBC 4.70 3.77 - 5.28 x10E6/uL   Hemoglobin 14.0 11.1 - 15.9 g/dL   Hematocrit 42.2 34.0 - 46.6 %   MCV 90 79 - 97 fL   MCH 29.8 26.6 - 33.0 pg   MCHC 33.2 31.5 - 35.7  g/dL   RDW 13.0 11.7 - 15.4 %   Platelets 357 150 - 450 x10E3/uL   Neutrophils 57 Not Estab. %   Lymphs 34 Not Estab. %   Monocytes 6 Not Estab. %   Eos 3 Not Estab. %   Basos 0 Not Estab. %   Neutrophils Absolute 5.9 1.4 - 7.0 x10E3/uL   Lymphocytes Absolute 3.6 (H) 0.7 - 3.1 x10E3/uL   Monocytes Absolute 0.6 0.1 - 0.9 x10E3/uL   EOS (ABSOLUTE) 0.3 0.0 - 0.4 x10E3/uL   Basophils Absolute 0.0 0.0 - 0.2 x10E3/uL   Immature Granulocytes 0 Not Estab. %   Immature Grans (Abs) 0.0 0.0 - 0.1 x10E3/uL  Comprehensive metabolic panel  Result Value Ref Range   Glucose 80 65 - 99 mg/dL   BUN 11 6 - 24 mg/dL   Creatinine, Ser 0.63 0.57 - 1.00 mg/dL   GFR calc non Af Amer 104 >59 mL/min/1.73   GFR calc Af Amer 120 >59 mL/min/1.73   BUN/Creatinine Ratio 17 9 - 23   Sodium  144 134 - 144 mmol/L   Potassium 4.7 3.5 - 5.2 mmol/L   Chloride 105 96 - 106 mmol/L   CO2 24 20 - 29 mmol/L   Calcium 9.7 8.7 - 10.2 mg/dL   Total Protein 6.5 6.0 - 8.5 g/dL   Albumin 4.5 3.8 - 4.9 g/dL   Globulin, Total 2.0 1.5 - 4.5 g/dL   Albumin/Globulin Ratio 2.3 (H) 1.2 - 2.2   Bilirubin Total <0.2 0.0 - 1.2 mg/dL   Alkaline Phosphatase 115 39 - 117 IU/L   AST 21 0 - 40 IU/L   ALT 22 0 - 32 IU/L  Lipid Panel w/o Chol/HDL Ratio out  Result Value Ref Range   Cholesterol, Total 225 (H) 100 - 199 mg/dL   Triglycerides 342 (H) 0 - 149 mg/dL   HDL 31 (L) >39 mg/dL   VLDL Cholesterol Cal 62 (H) 5 - 40 mg/dL   LDL Chol Calc (NIH) 132 (H) 0 - 99 mg/dL  TSH  Result Value Ref Range   TSH 1.170 0.450 - 4.500 uIU/mL  UA/M w/rflx Culture, Routine   Specimen: Urine   URINE  Result Value Ref Range   Specific Gravity, UA 1.025 1.005 - 1.030   pH, UA 5.5 5.0 - 7.5   Color, UA Yellow Yellow   Appearance Ur Clear Clear   Leukocytes,UA Negative Negative   Protein,UA Negative Negative/Trace   Glucose, UA Negative Negative   Ketones, UA Trace (A) Negative   RBC, UA Negative Negative   Bilirubin, UA Negative Negative   Urobilinogen, Ur 0.2 0.2 - 1.0 mg/dL   Nitrite, UA Negative Negative  Vit D  25 hydroxy (rtn osteoporosis monitoring)  Result Value Ref Range   Vit D, 25-Hydroxy 28.1 (L) 30.0 - 100.0 ng/mL  Cytology - PAP  Result Value Ref Range   High risk HPV Negative    Adequacy      Satisfactory for evaluation; transformation zone component ABSENT.   Diagnosis      - Negative for intraepithelial lesion or malignancy (NILM)   Microorganisms      Fungal organisms present consistent with Candida spp.   Comment Normal Reference Range HPV - Negative       Assessment & Plan:   Problem List Items Addressed This Visit      Respiratory   Allergic rhinitis - Primary    Stable and under good control, continue current regimen  Musculoskeletal and Integument   Psoriasis     Under good control, continue current regimen        Other   Vitamin D deficiency    Check vit D to ensure adequate supplementation      Relevant Orders   Vit D  25 hydroxy (rtn osteoporosis monitoring) (Completed)   Depression    Stable and under good control, continue current regimen      Chronic anxiety    Stable and under good control, continue current regimen      Hyperlipidemia    Recheck levels, diet reviewed. May need to add low dose statin if still elevated      Relevant Orders   Comprehensive metabolic panel (Completed)   Lipid Panel w/o Chol/HDL Ratio out (Completed)   Obesity (BMI 35.0-39.9 without comorbidity)    Long discussion about diet and exercise strategies and realistic goals      RESOLVED: Obesity    Other Visit Diagnoses    Annual physical exam       Relevant Orders   CBC with Differential/Platelet out (Completed)   TSH (Completed)   UA/M w/rflx Culture, Routine (Completed)   Screening for colon cancer       Relevant Orders   Cologuard   Encounter for screening for HIV       Relevant Orders   HIV antibody (with reflex) (Completed)   Encounter for screening mammogram for malignant neoplasm of breast       Relevant Orders   MM DIGITAL SCREENING BILATERAL   Screening for cervical cancer       Relevant Orders   Cytology - PAP (Completed)   Pain in both feet       Following with Podiatry, with good relief with injections, but these are costly. Diclofenac gel sent for prn use       Follow up plan: Return in about 6 months (around 04/16/2020) for 6 month f/u.   LABORATORY TESTING:  - Pap smear: pap done  IMMUNIZATIONS:   - Tdap: Tetanus vaccination status reviewed: last tetanus booster within 10 years. - Influenza: Administered today  SCREENING: -Mammogram: Ordered today  - Colonoscopy: cologuard ordered   PATIENT COUNSELING:   Advised to take 1 mg of folate supplement per day if capable of pregnancy.   Sexuality: Discussed  sexually transmitted diseases, partner selection, use of condoms, avoidance of unintended pregnancy  and contraceptive alternatives.   Advised to avoid cigarette smoking.  I discussed with the patient that most people either abstain from alcohol or drink within safe limits (<=14/week and <=4 drinks/occasion for males, <=7/weeks and <= 3 drinks/occasion for females) and that the risk for alcohol disorders and other health effects rises proportionally with the number of drinks per week and how often a drinker exceeds daily limits.  Discussed cessation/primary prevention of drug use and availability of treatment for abuse.   Diet: Encouraged to adjust caloric intake to maintain  or achieve ideal body weight, to reduce intake of dietary saturated fat and total fat, to limit sodium intake by avoiding high sodium foods and not adding table salt, and to maintain adequate dietary potassium and calcium preferably from fresh fruits, vegetables, and low-fat dairy products.    stressed the importance of regular exercise  Injury prevention: Discussed safety belts, safety helmets, smoke detector, smoking near bedding or upholstery.   Dental health: Discussed importance of regular tooth brushing, flossing, and dental visits.    NEXT PREVENTATIVE PHYSICAL DUE IN 1 YEAR. Return in  about 6 months (around 04/16/2020) for 6 month f/u.

## 2019-10-19 ENCOUNTER — Inpatient Hospital Stay (HOSPITAL_COMMUNITY): Admit: 2019-10-19 | Payer: BLUE CROSS/BLUE SHIELD | Source: Ambulatory Visit

## 2019-10-19 LAB — COMPREHENSIVE METABOLIC PANEL
ALT: 22 IU/L (ref 0–32)
AST: 21 IU/L (ref 0–40)
Albumin/Globulin Ratio: 2.3 — ABNORMAL HIGH (ref 1.2–2.2)
Albumin: 4.5 g/dL (ref 3.8–4.9)
Alkaline Phosphatase: 115 IU/L (ref 39–117)
BUN/Creatinine Ratio: 17 (ref 9–23)
BUN: 11 mg/dL (ref 6–24)
Bilirubin Total: <0.2 mg/dL (ref 0.0–1.2)
CO2: 24 mmol/L (ref 20–29)
Calcium: 9.7 mg/dL (ref 8.7–10.2)
Chloride: 105 mmol/L (ref 96–106)
Creatinine, Ser: 0.63 mg/dL (ref 0.57–1.00)
GFR calc Af Amer: 120 mL/min/{1.73_m2} (ref >59)
GFR calc non Af Amer: 104 mL/min/{1.73_m2} (ref >59)
Globulin, Total: 2 g/dL (ref 1.5–4.5)
Glucose: 80 mg/dL (ref 65–99)
Potassium: 4.7 mmol/L (ref 3.5–5.2)
Sodium: 144 mmol/L (ref 134–144)
Total Protein: 6.5 g/dL (ref 6.0–8.5)

## 2019-10-19 LAB — CBC WITH DIFFERENTIAL/PLATELET
Basophils Absolute: 0 10*3/uL (ref 0.0–0.2)
Basos: 0 %
EOS (ABSOLUTE): 0.3 10*3/uL (ref 0.0–0.4)
Eos: 3 %
Hematocrit: 42.2 % (ref 34.0–46.6)
Hemoglobin: 14 g/dL (ref 11.1–15.9)
Immature Grans (Abs): 0 10*3/uL (ref 0.0–0.1)
Immature Granulocytes: 0 %
Lymphocytes Absolute: 3.6 10*3/uL — ABNORMAL HIGH (ref 0.7–3.1)
Lymphs: 34 %
MCH: 29.8 pg (ref 26.6–33.0)
MCHC: 33.2 g/dL (ref 31.5–35.7)
MCV: 90 fL (ref 79–97)
Monocytes Absolute: 0.6 10*3/uL (ref 0.1–0.9)
Monocytes: 6 %
Neutrophils Absolute: 5.9 10*3/uL (ref 1.4–7.0)
Neutrophils: 57 %
Platelets: 357 10*3/uL (ref 150–450)
RBC: 4.7 x10E6/uL (ref 3.77–5.28)
RDW: 13 % (ref 11.7–15.4)
WBC: 10.5 10*3/uL (ref 3.4–10.8)

## 2019-10-19 LAB — LIPID PANEL W/O CHOL/HDL RATIO
Cholesterol, Total: 225 mg/dL — ABNORMAL HIGH (ref 100–199)
HDL: 31 mg/dL — ABNORMAL LOW (ref >39)
LDL Chol Calc (NIH): 132 mg/dL — ABNORMAL HIGH (ref 0–99)
Triglycerides: 342 mg/dL — ABNORMAL HIGH (ref 0–149)
VLDL Cholesterol Cal: 62 mg/dL — ABNORMAL HIGH (ref 5–40)

## 2019-10-19 LAB — VITAMIN D 25 HYDROXY (VIT D DEFICIENCY, FRACTURES): Vit D, 25-Hydroxy: 28.1 ng/mL — ABNORMAL LOW (ref 30.0–100.0)

## 2019-10-19 LAB — HIV ANTIBODY (ROUTINE TESTING W REFLEX): HIV Screen 4th Generation wRfx: NONREACTIVE

## 2019-10-19 LAB — TSH: TSH: 1.17 u[IU]/mL (ref 0.450–4.500)

## 2019-10-20 ENCOUNTER — Encounter: Payer: Self-pay | Admitting: Family Medicine

## 2019-10-20 ENCOUNTER — Other Ambulatory Visit: Payer: Self-pay | Admitting: Family Medicine

## 2019-10-20 LAB — CYTOLOGY - PAP
Adequacy: ABSENT
Comment: NEGATIVE
Diagnosis: NEGATIVE
High risk HPV: NEGATIVE

## 2019-10-20 MED ORDER — ATORVASTATIN CALCIUM 10 MG PO TABS: 10.0000 mg | ORAL_TABLET | Freq: Every day | ORAL | 1 refills | Status: DC

## 2019-10-23 ENCOUNTER — Other Ambulatory Visit: Payer: Self-pay | Admitting: Family Medicine

## 2019-10-23 ENCOUNTER — Telehealth: Payer: Self-pay

## 2019-10-23 DIAGNOSIS — E66812 Obesity, class 2: Secondary | ICD-10-CM | POA: Insufficient documentation

## 2019-10-23 DIAGNOSIS — E669 Obesity, unspecified: Secondary | ICD-10-CM | POA: Insufficient documentation

## 2019-10-23 MED ORDER — FLUCONAZOLE 150 MG PO TABS: 150.0000 mg | ORAL_TABLET | Freq: Once | ORAL | 0 refills | Status: AC

## 2019-10-23 NOTE — Assessment & Plan Note (Signed)
Stable and under good control, continue current regimen 

## 2019-10-23 NOTE — Assessment & Plan Note (Signed)
Check vit D to ensure adequate supplementation

## 2019-10-23 NOTE — Assessment & Plan Note (Signed)
Recheck levels, diet reviewed. May need to add low dose statin if still elevated

## 2019-10-23 NOTE — Assessment & Plan Note (Signed)
Long discussion about diet and exercise strategies and realistic goals

## 2019-10-23 NOTE — Telephone Encounter (Signed)
PA for Diclofenac initiated and submitted via Cover My Meds. Key: [id number]

## 2019-10-23 NOTE — Telephone Encounter (Signed)
PA denied.

## 2019-10-23 NOTE — Assessment & Plan Note (Signed)
Under good control, continue current regimen

## 2019-11-08 LAB — COLOGUARD: Cologuard: NEGATIVE

## 2019-12-25 ENCOUNTER — Other Ambulatory Visit: Payer: Self-pay | Admitting: Podiatry

## 2020-01-01 ENCOUNTER — Other Ambulatory Visit: Payer: Self-pay | Admitting: Family Medicine

## 2020-02-06 ENCOUNTER — Other Ambulatory Visit: Payer: Self-pay | Admitting: Family Medicine

## 2020-02-06 MED ORDER — CLOBETASOL PROPIONATE 0.05 % EX OINT: 1.0000 "application " | TOPICAL_OINTMENT | Freq: Two times a day (BID) | CUTANEOUS | 2 refills | Status: DC

## 2020-02-06 NOTE — Telephone Encounter (Signed)
Routing to provider  

## 2020-02-06 NOTE — Telephone Encounter (Signed)
Patient last seen 10/18/19 and has appointment 04/17/20

## 2020-02-06 NOTE — Telephone Encounter (Signed)
Requested medication (s) are due for refill today: Yes  Requested medication (s) are on the active medication list: Yes  Last refill:  02/06/20  Future visit scheduled: Yes  Notes to clinic:  Pharmacy asking for substitution for medication.    Requested Prescriptions  Pending Prescriptions Disp Refills   betamethasone dipropionate (DIPROLENE) 0.05 % ointment [Pharmacy Med Name: BETAMETHASONE DP 0.05% OINT] 30 g 0    Sig: Please specify directions, refills and quantity      Off-Protocol Failed - 02/06/2020 11:26 AM      Failed - Medication not assigned to a protocol, review manually.      Passed - Valid encounter within last 12 months    Recent Outpatient Visits           3 months ago Non-seasonal allergic rhinitis due to other allergic trigger   [facility] Volney American, [place]   7 months ago Chronic anxiety   [facility] Merrie Roof [place], [place]   9 months ago Right flank pain   [facility] Merrie Roof [place], [place]   1 year ago Recurrent major depressive disorder, in full remission [facility])   [facility] Volney American, [place]   1 year ago Chronic anxiety   [place], Lilia Argue, [place]       Future Appointments             In 2 months Orene Desanctis, Lilia Argue, [place], [place]

## 2020-02-09 ENCOUNTER — Ambulatory Visit
Admission: RE | Admit: 2020-02-09 | Discharge: 2020-02-09 | Disposition: A | Discharge status: Home / Self Care | Payer: 59 | Source: Ambulatory Visit | Attending: Family Medicine | Admitting: Family Medicine

## 2020-02-09 DIAGNOSIS — Z1231 Encounter for screening mammogram for malignant neoplasm of breast: Secondary | ICD-10-CM | POA: Insufficient documentation

## 2020-04-03 ENCOUNTER — Other Ambulatory Visit: Payer: Self-pay | Admitting: Family Medicine

## 2020-04-03 NOTE — Telephone Encounter (Signed)
Requested Prescriptions  Pending Prescriptions Disp Refills  . atorvastatin (LIPITOR) 10 MG tablet [Pharmacy Med Name: ATORVASTATIN 10 MG TABLET] 90 tablet 2    Sig: TAKE 1 TABLET BY MOUTH EVERY DAY     Cardiovascular:  Antilipid - Statins Failed - 04/03/2020  2:05 AM      Failed - Total Cholesterol in normal range and within 360 days    Cholesterol, Total  Date Value Ref Range Status  10/18/2019 225 (H) 100 - 199 mg/dL Final         Failed - LDL in normal range and within 360 days    LDL Chol Calc (NIH)  Date Value Ref Range Status  10/18/2019 132 (H) 0 - 99 mg/dL Final         Failed - HDL in normal range and within 360 days    HDL  Date Value Ref Range Status  10/18/2019 31 (L) >39 mg/dL Final         Failed - Triglycerides in normal range and within 360 days    Triglycerides  Date Value Ref Range Status  10/18/2019 342 (H) 0 - 149 mg/dL Final         Passed - Patient is not pregnant      Passed - Valid encounter within last 12 months    Recent Outpatient Visits          5 months ago Non-seasonal allergic rhinitis due to other allergic trigger   [facility] Volney American, [place]   9 months ago Chronic anxiety   [place], [place], [place]   11 months ago Right flank pain   [place], [place], [place]   1 year ago Recurrent major depressive disorder, in full remission [facility])   [facility] Volney American, [place]   1 year ago Chronic anxiety   [place], Lilia Argue, [place]      Future Appointments            In 2 weeks Orene Desanctis, Lilia Argue, PA-C [facility], [facility]

## 2020-04-17 ENCOUNTER — Ambulatory Visit: Payer: BLUE CROSS/BLUE SHIELD | Admitting: Family Medicine

## 2020-04-19 ENCOUNTER — Other Ambulatory Visit: Payer: Self-pay

## 2020-04-19 ENCOUNTER — Encounter: Payer: Self-pay | Admitting: Family Medicine

## 2020-04-19 ENCOUNTER — Ambulatory Visit (INDEPENDENT_AMBULATORY_CARE_PROVIDER_SITE_OTHER): Payer: 59 | Admitting: Family Medicine

## 2020-04-19 VITALS — BP 126/83 | HR 52 | Temp 97.9°F | Wt 222.0 lb

## 2020-04-19 DIAGNOSIS — E782 Mixed hyperlipidemia: Secondary | ICD-10-CM

## 2020-04-19 DIAGNOSIS — F3342 Major depressive disorder, recurrent, in full remission: Secondary | ICD-10-CM

## 2020-04-19 DIAGNOSIS — F419 Anxiety disorder, unspecified: Secondary | ICD-10-CM

## 2020-04-19 DIAGNOSIS — J3089 Other allergic rhinitis: Secondary | ICD-10-CM | POA: Diagnosis not present

## 2020-04-19 DIAGNOSIS — L409 Psoriasis, unspecified: Secondary | ICD-10-CM

## 2020-04-19 NOTE — Progress Notes (Signed)
BP 126/83   Pulse (!) 52   Temp 97.9 F (36.6 C) (Oral)   Wt 222 lb (100.7 kg)   LMP 06/26/2015 (Exact Date)   SpO2 98%   BMI 35.83 kg/m    Subjective:    Patient ID: Bianca Clay, female    DOB: 12-31-1965, 54 y.o.   MRN: [id number]  HPI: Bianca Clay is a 54 y.o. female  Chief Complaint  Patient presents with  . Hyperlipidemia  . Depression  . Anxiety   Presenting today for 6 month f/u chronic conditions. Denies new concerns, states things are going well and she actually feels her moods/anxiety are better than usual since changing jobs recently. Medications working well, no side effects and taking faithfully. Denies CP, SOB, claudication, myalgias, SI/HI.   Depression screen [facility] 2/9 04/19/2020 10/18/2019 06/28/2019  Decreased Interest 0 1 0  Down, Depressed, Hopeless 0 0 0  PHQ - 2 Score 0 1 0  Altered sleeping 1 2 1   Tired, decreased energy 1 1 1   Change in appetite 0 1 0  Feeling bad or failure about yourself  0 0 0  Trouble concentrating 0 0 0  Moving slowly or fidgety/restless 0 0 0  Suicidal thoughts 0 0 0  PHQ-9 Score 2 5 2    GAD 7 : Generalized Anxiety Score 04/19/2020 10/18/2019 06/28/2019 09/21/2018  Nervous, Anxious, on Edge 1 1 2 2   Control/stop worrying 0 1 1 1   Worry too much - different things 0 1 1 1   Trouble relaxing 0 0 1 2  Restless 0 0 0 1  Easily annoyed or irritable 1 1 1 2   Afraid - awful might happen 0 0 0 0  Total GAD 7 Score 2 4 6 9   Anxiety Difficulty Not difficult at all - Somewhat difficult Not difficult at all     Relevant past medical, surgical, family and social history reviewed and updated as indicated. Interim medical history since our last visit reviewed. Allergies and medications reviewed and updated.  Review of Systems  Per HPI unless specifically indicated above     Objective:    BP 126/83   Pulse (!) 52   Temp 97.9 F (36.6 C) (Oral)   Wt 222 lb (100.7 kg)   LMP 06/26/2015 (Exact Date)   SpO2 98%    BMI 35.83 kg/m   Wt Readings from Last 3 Encounters:  04/19/20 222 lb (100.7 kg)  10/18/19 220 lb (99.8 kg)  06/28/19 210 lb (95.3 kg)    Physical Exam Vitals and nursing note reviewed.  Constitutional:      Appearance: Normal appearance. She is not ill-appearing.  HENT:     Head: Atraumatic.  Eyes:     Extraocular Movements: Extraocular movements intact.     Conjunctiva/sclera: Conjunctivae normal.  Cardiovascular:     Rate and Rhythm: Normal rate and regular rhythm.     Heart sounds: Normal heart sounds.  Pulmonary:     Effort: Pulmonary effort is normal.     Breath sounds: Normal breath sounds.  Musculoskeletal:        General: Normal range of motion.     Cervical back: Normal range of motion and neck supple.  Skin:    General: Skin is warm and dry.  Neurological:     Mental Status: She is alert and oriented to person, place, and time.  Psychiatric:        Mood and Affect: Mood normal.  Thought Content: Thought content normal.        Judgment: Judgment normal.     Results for orders placed or performed in visit on 04/19/20  Comprehensive metabolic panel  Result Value Ref Range   Glucose 87 65 - 99 mg/dL   BUN 14 6 - 24 mg/dL   Creatinine, Ser 0.63 0.57 - 1.00 mg/dL   GFR calc non Af Amer 104 >59 mL/min/1.73   GFR calc Af Amer 120 >59 mL/min/1.73   BUN/Creatinine Ratio 22 9 - 23   Sodium 141 134 - 144 mmol/L   Potassium 4.5 3.5 - 5.2 mmol/L   Chloride 106 96 - 106 mmol/L   CO2 23 20 - 29 mmol/L   Calcium 9.7 8.7 - 10.2 mg/dL   Total Protein 6.3 6.0 - 8.5 g/dL   Albumin 4.4 3.8 - 4.9 g/dL   Globulin, Total 1.9 1.5 - 4.5 g/dL   Albumin/Globulin Ratio 2.3 (H) 1.2 - 2.2   Bilirubin Total <0.2 0.0 - 1.2 mg/dL   Alkaline Phosphatase 121 48 - 121 IU/L   AST 18 0 - 40 IU/L   ALT 18 0 - 32 IU/L  Lipid Panel w/o Chol/HDL Ratio  Result Value Ref Range   Cholesterol, Total 166 100 - 199 mg/dL   Triglycerides 249 (H) 0 - 149 mg/dL   HDL 33 (L) >39 mg/dL    VLDL Cholesterol Cal 42 (H) 5 - 40 mg/dL   LDL Chol Calc (NIH) 91 0 - 99 mg/dL      Assessment & Plan:   Problem List Items Addressed This Visit      Respiratory   Allergic rhinitis    Stable and well controlled, continue current regimen        Musculoskeletal and Integument   Psoriasis    Stable on prn steroid cream and moisturizers, continue current regimen        Other   Depression - Primary    Stable, under good control. [place] current regimen      Chronic anxiety    Chronic, stable and well controlled. Continue current regimen      Hyperlipidemia    Recheck lipids, adjust as needed. Continue current regimen and working on lifestyle modifications      Relevant Orders   Comprehensive metabolic panel (Completed)   Lipid Panel w/o Chol/HDL Ratio (Completed)       Follow up plan: Return in about 6 months (around 10/20/2020) for 6 month f/u.

## 2020-04-20 LAB — COMPREHENSIVE METABOLIC PANEL
ALT: 18 IU/L (ref 0–32)
AST: 18 IU/L (ref 0–40)
Albumin/Globulin Ratio: 2.3 — ABNORMAL HIGH (ref 1.2–2.2)
Albumin: 4.4 g/dL (ref 3.8–4.9)
Alkaline Phosphatase: 121 IU/L (ref 48–121)
BUN/Creatinine Ratio: 22 (ref 9–23)
BUN: 14 mg/dL (ref 6–24)
Bilirubin Total: <0.2 mg/dL (ref 0.0–1.2)
CO2: 23 mmol/L (ref 20–29)
Calcium: 9.7 mg/dL (ref 8.7–10.2)
Chloride: 106 mmol/L (ref 96–106)
Creatinine, Ser: 0.63 mg/dL (ref 0.57–1.00)
GFR calc Af Amer: 120 mL/min/{1.73_m2} (ref >59)
GFR calc non Af Amer: 104 mL/min/{1.73_m2} (ref >59)
Globulin, Total: 1.9 g/dL (ref 1.5–4.5)
Glucose: 87 mg/dL (ref 65–99)
Potassium: 4.5 mmol/L (ref 3.5–5.2)
Sodium: 141 mmol/L (ref 134–144)
Total Protein: 6.3 g/dL (ref 6.0–8.5)

## 2020-04-20 LAB — LIPID PANEL W/O CHOL/HDL RATIO
Cholesterol, Total: 166 mg/dL (ref 100–199)
HDL: 33 mg/dL — ABNORMAL LOW (ref >39)
LDL Chol Calc (NIH): 91 mg/dL (ref 0–99)
Triglycerides: 249 mg/dL — ABNORMAL HIGH (ref 0–149)
VLDL Cholesterol Cal: 42 mg/dL — ABNORMAL HIGH (ref 5–40)

## 2020-04-21 NOTE — Assessment & Plan Note (Signed)
Chronic, stable and well controlled. Continue current regimen °

## 2020-04-21 NOTE — Assessment & Plan Note (Signed)
Stable and well controlled, continue current regimen 

## 2020-04-21 NOTE — Assessment & Plan Note (Signed)
Recheck lipids, adjust as needed. Continue current regimen and working on lifestyle modifications

## 2020-04-21 NOTE — Assessment & Plan Note (Signed)
Stable, under good control. Cotninue current regimen

## 2020-04-21 NOTE — Assessment & Plan Note (Signed)
Stable on prn steroid cream and moisturizers, continue current regimen

## 2020-04-27 ENCOUNTER — Other Ambulatory Visit: Payer: Self-pay | Admitting: Podiatry

## 2020-05-03 ENCOUNTER — Telehealth: Payer: Self-pay | Admitting: Family Medicine

## 2020-05-03 NOTE — Telephone Encounter (Signed)
Noted  

## 2020-05-03 NOTE — Telephone Encounter (Signed)
Copied from [place] [id number]. Topic: General - Other >> May 03, 2020  2:54 PM Rainey Pines A wrote: Patient wants to know if we have a hepatitis B shot on file for her

## 2020-05-03 NOTE — Telephone Encounter (Signed)
Spoke with pt and explained we do not have a record of her receiving the HepB vaccine. Pt expressed she was pretty sure she had had it, so I advised she could have a titer completed to check for immunity. Pt stated her job is offering the Hep b vaccine, however she just received her first covid vaccine so she will be declining at thsi time. Pt stated she will follow up on this when she returns in 6 months for her visit.

## 2020-05-24 IMAGING — MG DIGITAL SCREENING BILATERAL MAMMOGRAM WITH TOMO AND CAD
8 series · 8 of 24 positions shown · non-contrast
Comparison: Previous exam(s).

CLINICAL DATA: Screening.

EXAM:
DIGITAL SCREENING BILATERAL MAMMOGRAM WITH TOMO AND CAD

[R CC synth-2D]
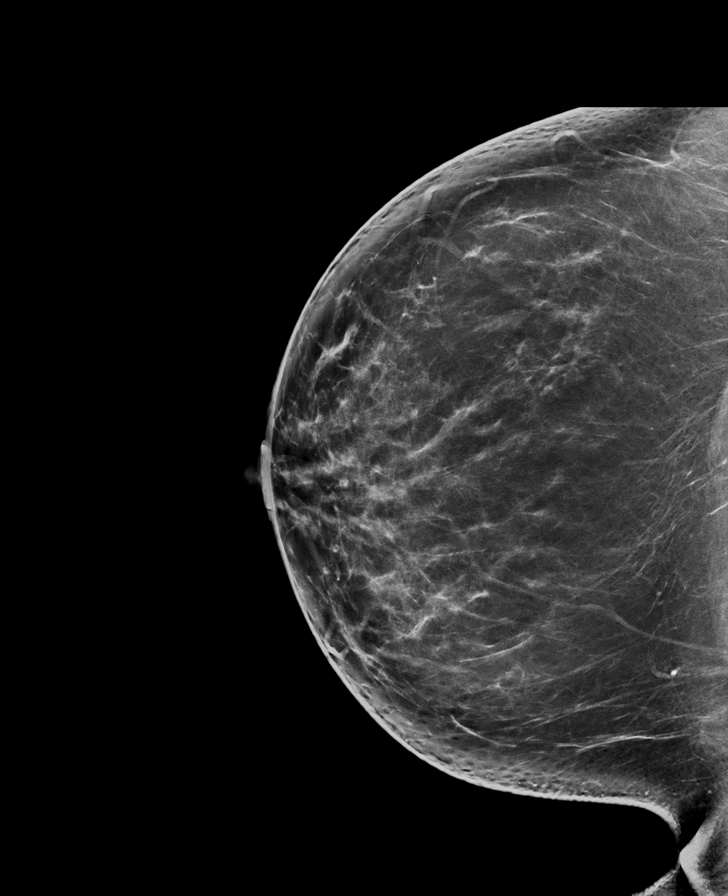

[L MLO synth-2D]
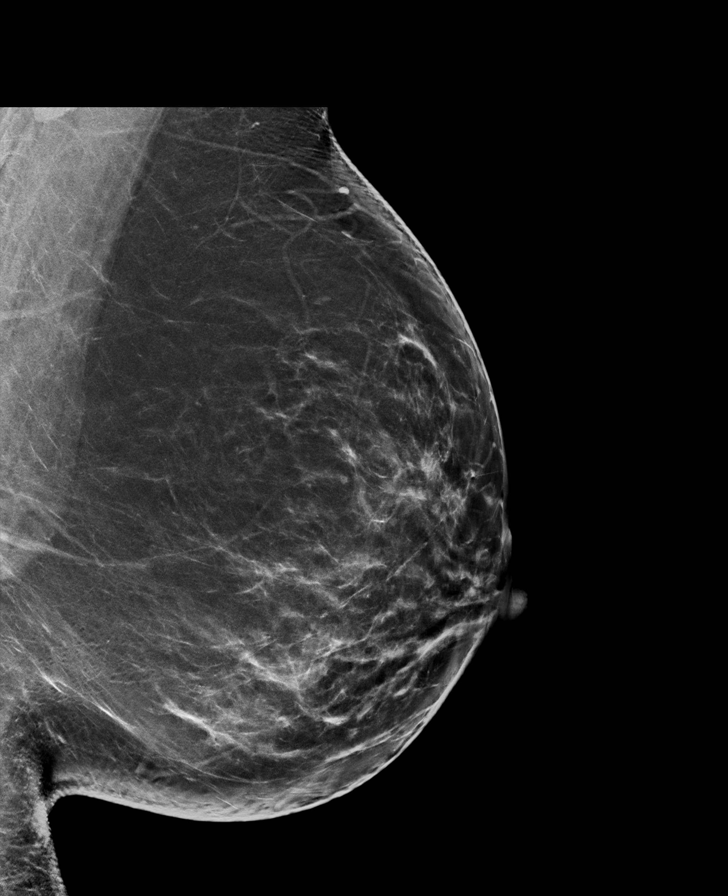

[L CC synth-2D]
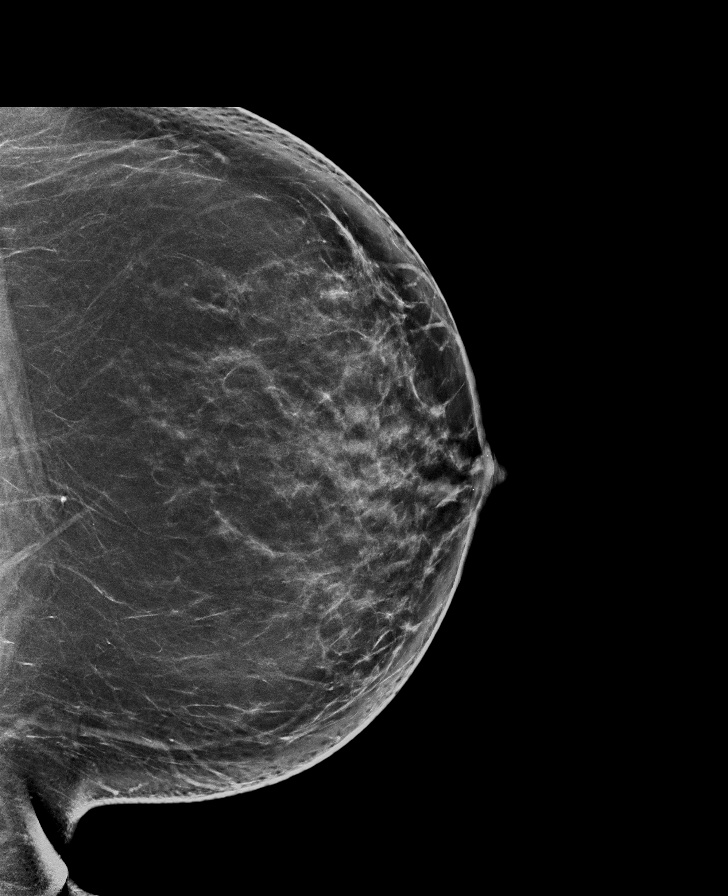

[R MLO synth-2D]
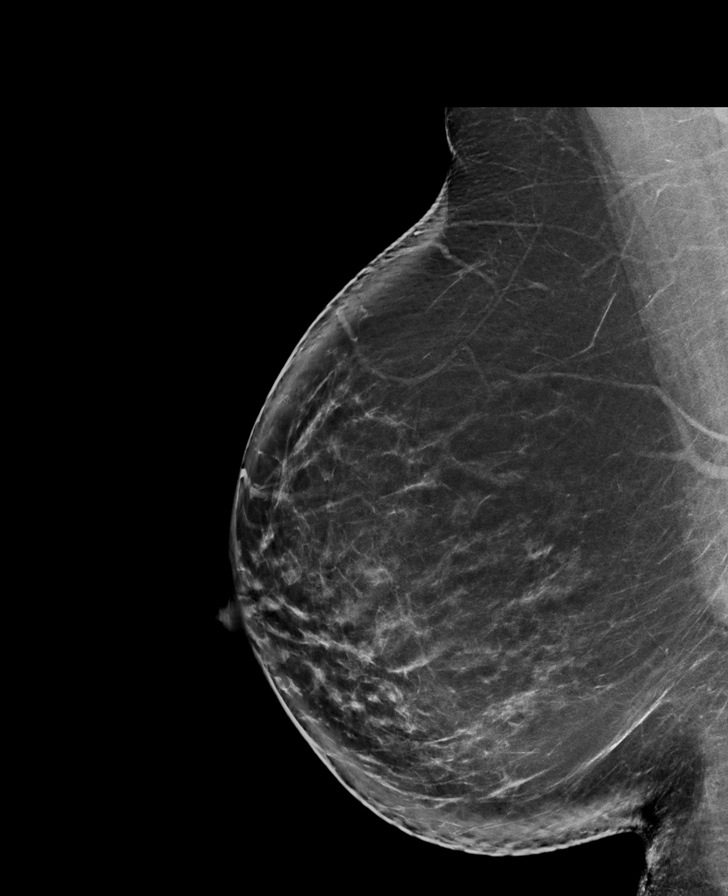

[R CC tomo · tomo slice 47/93.0]
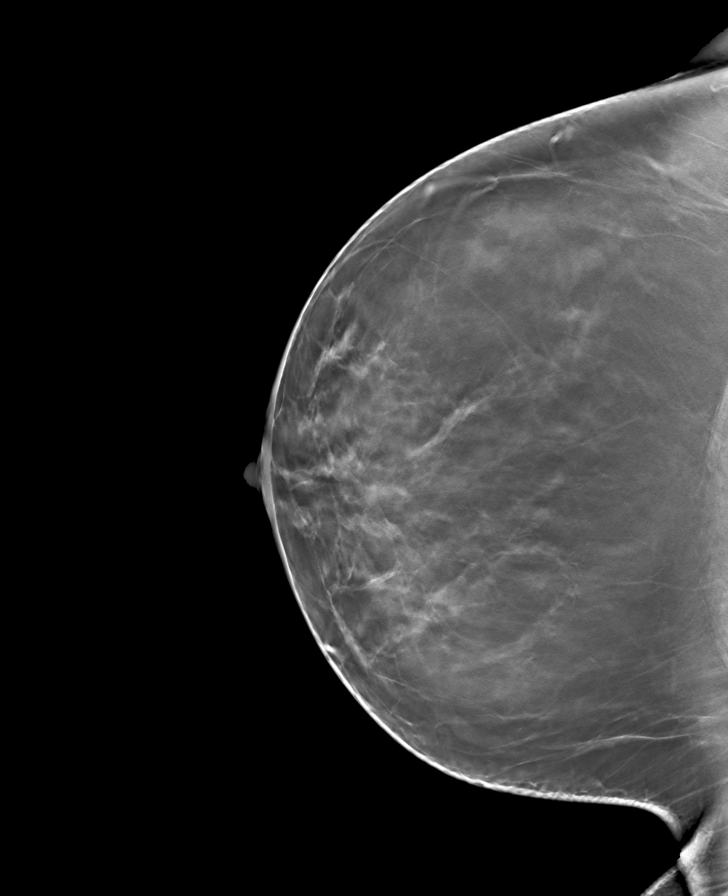

[L MLO tomo · tomo slice 48/95.0]
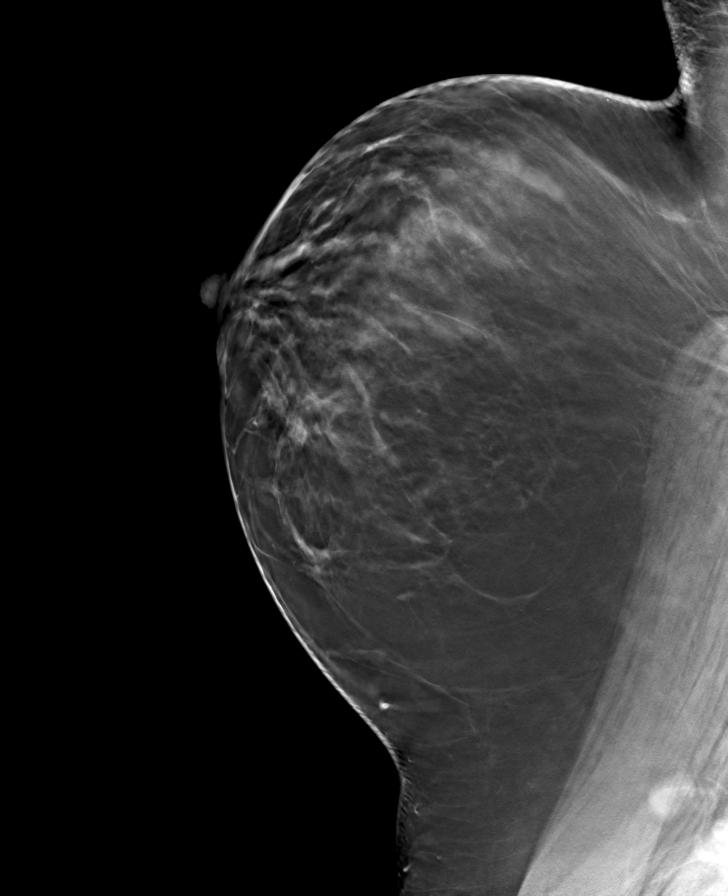

[R MLO tomo · tomo slice 49/98.0]
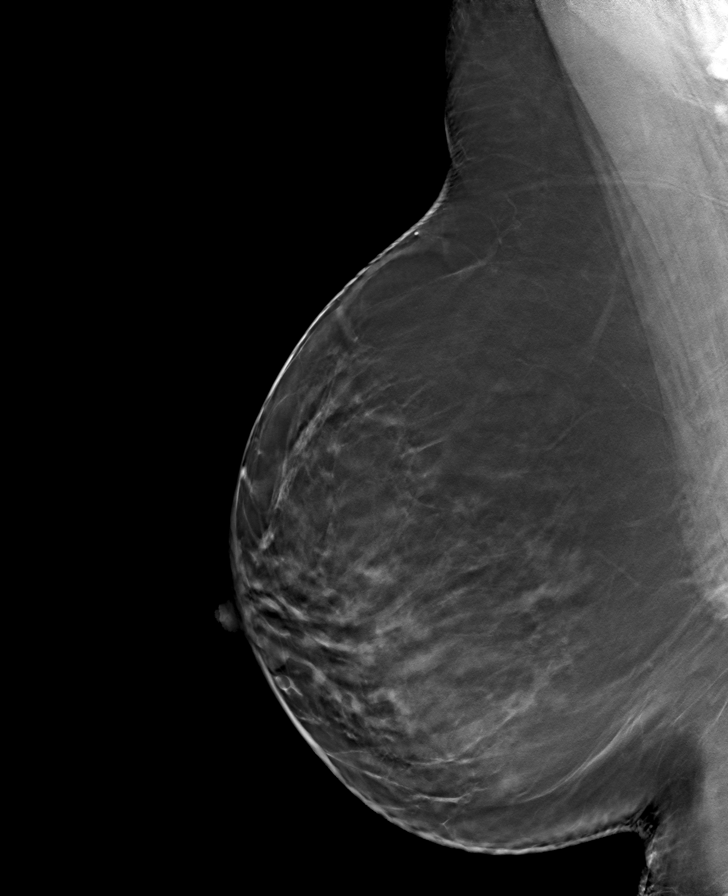

[L CC tomo · tomo slice 46/91.0]
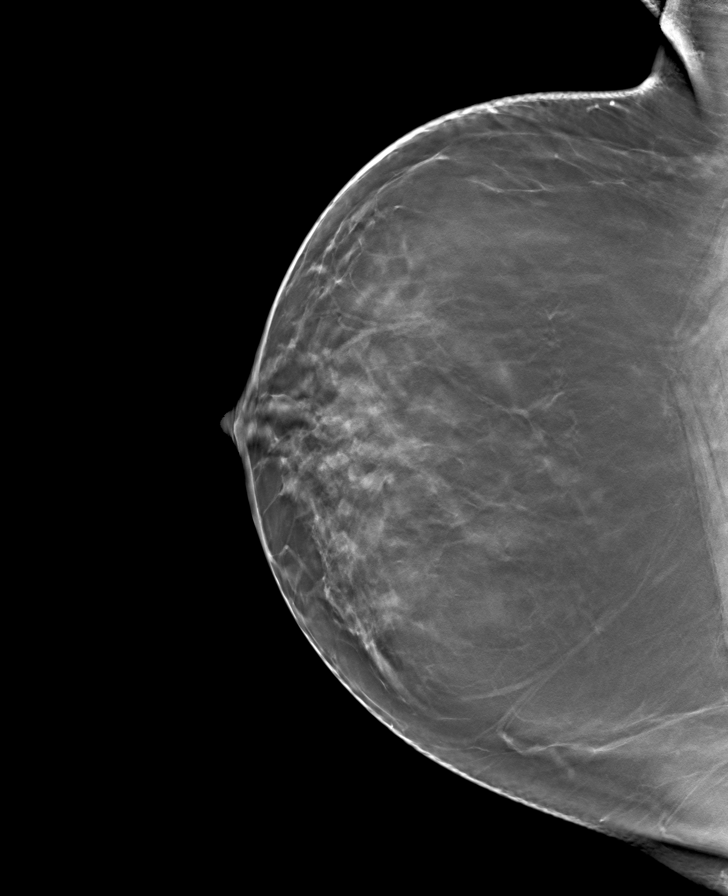

[8 of 24 positions shown; findings below may reference images not displayed]

ACR Breast Density Category b: There are scattered areas of
fibroglandular density.
FINDINGS: There are no findings suspicious for malignancy. Images were
processed with CAD.
IMPRESSION: No mammographic evidence of malignancy. A result letter of this
screening mammogram will be mailed directly to the patient.

RECOMMENDATION:
Screening mammogram in one year. (Code:[id number])

BI-RADS CATEGORY  1: Negative.

## 2020-06-06 ENCOUNTER — Other Ambulatory Visit: Payer: Self-pay | Admitting: Podiatry

## 2020-06-14 ENCOUNTER — Other Ambulatory Visit: Payer: Self-pay | Admitting: Family Medicine

## 2020-07-08 ENCOUNTER — Encounter: Payer: Self-pay | Admitting: Family Medicine

## 2020-10-30 ENCOUNTER — Ambulatory Visit: Payer: 59 | Admitting: Family Medicine

## 2020-11-08 ENCOUNTER — Other Ambulatory Visit: Payer: Self-pay

## 2020-11-08 ENCOUNTER — Encounter: Payer: Self-pay | Admitting: Family Medicine

## 2020-11-08 ENCOUNTER — Ambulatory Visit (INDEPENDENT_AMBULATORY_CARE_PROVIDER_SITE_OTHER): Payer: 59 | Admitting: Family Medicine

## 2020-11-08 VITALS — BP 106/74 | HR 71 | Temp 97.8°F | Wt 225.8 lb

## 2020-11-08 DIAGNOSIS — F3342 Major depressive disorder, recurrent, in full remission: Secondary | ICD-10-CM

## 2020-11-08 DIAGNOSIS — F419 Anxiety disorder, unspecified: Secondary | ICD-10-CM | POA: Diagnosis not present

## 2020-11-08 DIAGNOSIS — E782 Mixed hyperlipidemia: Secondary | ICD-10-CM

## 2020-11-08 DIAGNOSIS — E559 Vitamin D deficiency, unspecified: Secondary | ICD-10-CM | POA: Diagnosis not present

## 2020-11-08 DIAGNOSIS — Z833 Family history of diabetes mellitus: Secondary | ICD-10-CM

## 2020-11-08 LAB — BAYER DCA HB A1C WAIVED: HB A1C (BAYER DCA - WAIVED): 5.6 % (ref <7.0)

## 2020-11-08 MED ORDER — SERTRALINE HCL 100 MG PO TABS: 100.0000 mg | ORAL_TABLET | Freq: Every day | ORAL | 1 refills | Status: DC

## 2020-11-08 MED ORDER — HYDROXYZINE HCL 25 MG PO TABS: 25.0000 mg | ORAL_TABLET | Freq: Three times a day (TID) | ORAL | 1 refills | Status: DC | PRN

## 2020-11-08 MED ORDER — ATORVASTATIN CALCIUM 10 MG PO TABS: 10.0000 mg | ORAL_TABLET | Freq: Every day | ORAL | 1 refills | Status: DC

## 2020-11-08 NOTE — Progress Notes (Signed)
BP 106/74   Pulse 71   Temp 97.8 F (36.6 C)   Wt 225 lb 12.8 oz (102.4 kg)   LMP 06/26/2015 (Exact Date)   SpO2 96%   BMI 36.45 kg/m    Subjective:    Patient ID: Bianca Clay, female    DOB: 27-Apr-1966, 55 y.o.   MRN: [id number]  HPI: Bianca Clay is a 54 y.o. female  Chief Complaint  Patient presents with  . Depression  . Anxiety   DEPRESSION- both her parents died this year  Mood status: controlled Satisfied with current treatment?: yes Symptom severity: mild  Duration of current treatment : chronic Side effects: no Medication compliance: excellent compliance Psychotherapy/counseling: no  Previous psychiatric medications: prozac, hydroxyzine Depressed mood: no Anxious mood: no Anhedonia: no Significant weight loss or gain: no Insomnia: no  Fatigue: no Feelings of worthlessness or guilt: no Impaired concentration/indecisiveness: no Suicidal ideations: no Hopelessness: no Crying spells: no Depression screen [facility] 2/9 11/08/2020 04/19/2020 10/18/2019 06/28/2019 09/21/2018  Decreased Interest 1 0 1 0 1  Down, Depressed, Hopeless 1 0 0 0 1  PHQ - 2 Score 2 0 1 0 2  Altered sleeping 1 1 2 1 1   Tired, decreased energy 0 1 1 1  0  Change in appetite 0 0 1 0 1  Feeling bad or failure about yourself  0 0 0 0 0  Trouble concentrating 0 0 0 0 0  Moving slowly or fidgety/restless 0 0 0 0 1  Suicidal thoughts 0 0 0 0 0  PHQ-9 Score 3 2 5 2 5   Difficult doing work/chores Not difficult at all - - - -   HYPERLIPIDEMIA Hyperlipidemia status: excellent compliance Satisfied with current treatment?  yes Side effects:  no Medication compliance: excellent compliance Past cholesterol meds: atorvastatin Supplements: none Aspirin:  no The 10-year ASCVD risk score Mikey Bussing DC Jr., et al., 2013) is: 3.9%   Values used to calculate the score:     Age: 25 years     Sex: Female     Is Non-Hispanic African American: No     Diabetic: No     Tobacco smoker: Yes      Systolic Blood Pressure: 580 mmHg     Is BP treated: No     HDL Cholesterol: 33 mg/dL     Total Cholesterol: 166 mg/dL Chest pain:  no Coronary artery disease:  no Family history CAD:  yes Family history early CAD:  no   Relevant past medical, surgical, family and social history reviewed and updated as indicated. Interim medical history since our last visit reviewed. Allergies and medications reviewed and updated.  Review of Systems  Constitutional: Negative.   Respiratory: Negative.   Cardiovascular: Negative.   Gastrointestinal: Negative.   Musculoskeletal: Negative.   Psychiatric/Behavioral: Negative.    Per HPI unless specifically indicated above     Objective:    BP 106/74   Pulse 71   Temp 97.8 F (36.6 C)   Wt 225 lb 12.8 oz (102.4 kg)   LMP 06/26/2015 (Exact Date)   SpO2 96%   BMI 36.45 kg/m   Wt Readings from Last 3 Encounters:  11/08/20 225 lb 12.8 oz (102.4 kg)  04/19/20 222 lb (100.7 kg)  10/18/19 220 lb (99.8 kg)    Physical Exam Vitals and nursing note reviewed.  Constitutional:      General: She is not in acute distress.    Appearance: Normal appearance. She is not ill-appearing, toxic-appearing or diaphoretic.  HENT:     Head: Normocephalic and atraumatic.     Right Ear: External ear normal.     Left Ear: External ear normal.     Nose: Nose normal.     Mouth/Throat:     Mouth: Mucous membranes are moist.     Pharynx: Oropharynx is clear.  Eyes:     General: No scleral icterus.       Right eye: No discharge.        Left eye: No discharge.     Extraocular Movements: Extraocular movements intact.     Conjunctiva/sclera: Conjunctivae normal.     Pupils: Pupils are equal, round, and reactive to light.  Cardiovascular:     Rate and Rhythm: Normal rate and regular rhythm.     Pulses: Normal pulses.     Heart sounds: Normal heart sounds. No murmur heard. No friction rub. No gallop.   Pulmonary:     Effort: Pulmonary effort is normal. No  respiratory distress.     Breath sounds: Normal breath sounds. No stridor. No wheezing, rhonchi or rales.  Chest:     Chest wall: No tenderness.  Musculoskeletal:        General: Normal range of motion.     Cervical back: Normal range of motion and neck supple.  Skin:    General: Skin is warm and dry.     Capillary Refill: Capillary refill takes less than 2 seconds.     Coloration: Skin is not jaundiced or pale.     Findings: No bruising, erythema, lesion or rash.  Neurological:     General: No focal deficit present.     Mental Status: She is alert and oriented to person, place, and time. Mental status is at baseline.  Psychiatric:        Mood and Affect: Mood normal.        Behavior: Behavior normal.        Thought Content: Thought content normal.        Judgment: Judgment normal.     Results for orders placed or performed in visit on 04/19/20  Comprehensive metabolic panel  Result Value Ref Range   Glucose 87 65 - 99 mg/dL   BUN 14 6 - 24 mg/dL   Creatinine, Ser 0.63 0.57 - 1.00 mg/dL   GFR calc non Af Amer 104 >59 mL/min/1.73   GFR calc Af Amer 120 >59 mL/min/1.73   BUN/Creatinine Ratio 22 9 - 23   Sodium 141 134 - 144 mmol/L   Potassium 4.5 3.5 - 5.2 mmol/L   Chloride 106 96 - 106 mmol/L   CO2 23 20 - 29 mmol/L   Calcium 9.7 8.7 - 10.2 mg/dL   Total Protein 6.3 6.0 - 8.5 g/dL   Albumin 4.4 3.8 - 4.9 g/dL   Globulin, Total 1.9 1.5 - 4.5 g/dL   Albumin/Globulin Ratio 2.3 (H) 1.2 - 2.2   Bilirubin Total <0.2 0.0 - 1.2 mg/dL   Alkaline Phosphatase 121 48 - 121 IU/L   AST 18 0 - 40 IU/L   ALT 18 0 - 32 IU/L  Lipid Panel w/o Chol/HDL Ratio  Result Value Ref Range   Cholesterol, Total 166 100 - 199 mg/dL   Triglycerides 249 (H) 0 - 149 mg/dL   HDL 33 (L) >39 mg/dL   VLDL Cholesterol Cal 42 (H) 5 - 40 mg/dL   LDL Chol Calc (NIH) 91 0 - 99 mg/dL      Assessment & Plan:   Problem  List Items Addressed This Visit      Other   Vitamin D deficiency    Rechecking labs  today. Await results. Treat as needed.       Relevant Orders   VITAMIN D 25 Hydroxy (Vit-D Deficiency, Fractures)   Depression    Under good control on current regimen. Continue current regimen. Continue to monitor. Call with any concerns. Refills given.        Relevant Medications   sertraline (ZOLOFT) 100 MG tablet   hydrOXYzine (ATARAX/VISTARIL) 25 MG tablet   Chronic anxiety    Under good control on current regimen. Continue current regimen. Continue to monitor. Call with any concerns. Refills given.        Relevant Medications   sertraline (ZOLOFT) 100 MG tablet   hydrOXYzine (ATARAX/VISTARIL) 25 MG tablet   Hyperlipidemia - Primary    Under good control on current regimen. Continue current regimen. Continue to monitor. Call with any concerns. Refills given. Labs drawn today.       Relevant Medications   atorvastatin (LIPITOR) 10 MG tablet   Other Relevant Orders   Comprehensive metabolic panel   Lipid Panel w/o Chol/HDL Ratio    Other Visit Diagnoses    Family history of diabetes mellitus (DM)       Checking labs today. Await results. Treat as needed.    Relevant Orders   Bayer DCA Hb A1c Waived   Comprehensive metabolic panel       Follow up plan: Return in about 6 months (around 05/09/2021) for physical.

## 2020-11-08 NOTE — Assessment & Plan Note (Signed)
Rechecking labs today. Await results. Treat as needed.  °

## 2020-11-08 NOTE — Assessment & Plan Note (Signed)
Under good control on current regimen. Continue current regimen. Continue to monitor. Call with any concerns. Refills given.   

## 2020-11-08 NOTE — Assessment & Plan Note (Signed)
Under good control on current regimen. Continue current regimen. Continue to monitor. Call with any concerns. Refills given. Labs drawn today.   

## 2020-11-09 LAB — COMPREHENSIVE METABOLIC PANEL
ALT: 16 IU/L (ref 0–32)
AST: 17 IU/L (ref 0–40)
Albumin/Globulin Ratio: 1.9 (ref 1.2–2.2)
Albumin: 4.3 g/dL (ref 3.8–4.9)
Alkaline Phosphatase: 114 IU/L (ref 44–121)
BUN/Creatinine Ratio: 18 (ref 9–23)
BUN: 12 mg/dL (ref 6–24)
Bilirubin Total: 0.3 mg/dL (ref 0.0–1.2)
CO2: 24 mmol/L (ref 20–29)
Calcium: 9.4 mg/dL (ref 8.7–10.2)
Chloride: 104 mmol/L (ref 96–106)
Creatinine, Ser: 0.67 mg/dL (ref 0.57–1.00)
GFR calc Af Amer: 117 mL/min/{1.73_m2} (ref >59)
GFR calc non Af Amer: 101 mL/min/{1.73_m2} (ref >59)
Globulin, Total: 2.3 g/dL (ref 1.5–4.5)
Glucose: 91 mg/dL (ref 65–99)
Potassium: 4.2 mmol/L (ref 3.5–5.2)
Sodium: 142 mmol/L (ref 134–144)
Total Protein: 6.6 g/dL (ref 6.0–8.5)

## 2020-11-09 LAB — LIPID PANEL W/O CHOL/HDL RATIO
Cholesterol, Total: 168 mg/dL (ref 100–199)
HDL: 32 mg/dL — ABNORMAL LOW (ref >39)
LDL Chol Calc (NIH): 98 mg/dL (ref 0–99)
Triglycerides: 219 mg/dL — ABNORMAL HIGH (ref 0–149)
VLDL Cholesterol Cal: 38 mg/dL (ref 5–40)

## 2020-11-09 LAB — VITAMIN D 25 HYDROXY (VIT D DEFICIENCY, FRACTURES): Vit D, 25-Hydroxy: 35.8 ng/mL (ref 30.0–100.0)

## 2020-11-09 NOTE — Progress Notes (Signed)
Contacted via [place] morning Bianca Clay, your labs have returned and overall remain stable.  Continue all current medications and follow-up visits.  Have a great day!! Keep being awesome!!  Thank you for allowing me to participate in your care. Kindest regards, Horace Wishon

## 2021-07-08 ENCOUNTER — Other Ambulatory Visit: Payer: Self-pay | Admitting: Family Medicine

## 2021-07-16 ENCOUNTER — Other Ambulatory Visit: Payer: Self-pay | Admitting: Nurse Practitioner

## 2021-07-16 ENCOUNTER — Other Ambulatory Visit: Payer: Self-pay | Admitting: Family Medicine

## 2021-07-16 DIAGNOSIS — Z1231 Encounter for screening mammogram for malignant neoplasm of breast: Secondary | ICD-10-CM

## 2021-07-25 ENCOUNTER — Ambulatory Visit
Admission: RE | Admit: 2021-07-25 | Discharge: 2021-07-25 | Disposition: A | Discharge status: Home / Self Care | Payer: 59 | Source: Ambulatory Visit | Attending: Family Medicine | Admitting: Family Medicine

## 2021-07-25 ENCOUNTER — Other Ambulatory Visit: Payer: Self-pay

## 2021-07-25 DIAGNOSIS — Z1231 Encounter for screening mammogram for malignant neoplasm of breast: Secondary | ICD-10-CM | POA: Insufficient documentation

## 2021-07-31 ENCOUNTER — Ambulatory Visit: Payer: Self-pay | Admitting: *Deleted

## 2021-07-31 NOTE — Telephone Encounter (Signed)
Routing to provider, FYI.  

## 2021-07-31 NOTE — Telephone Encounter (Signed)
Noted, will plan to schedule f/u after UC visit -- leave as FYI for Santiago Glad.

## 2021-07-31 NOTE — Telephone Encounter (Signed)
Patient has behind the right knee a pulling sharp pain along with bilateral foot pain daily that has worsened over the last week. Pain is worse after sitting when she stands to walk upstairs. Denies calf pain, No swelling/heat to the back of knee/calf. Denies SOB/CP. Can work out the behind the knee pain with massaging at times. Works on her feet all day and thinks she may have strained knee. Prefers to be seen today. No availability today. Offered appointment tomorrow-declined. No Care advice provided as she will go to UC or [facility] today.    Reason for Disposition  [1] MODERATE pain (e.g., interferes with normal activities, limping) AND [2] present > 3 days  Answer Assessment - Initial Assessment Questions 1. LOCATION and RADIATION: "Where is the pain located?"      Behind right knee 2. QUALITY: "What does the pain feel like?"  (e.g., sharp, dull, aching, burning)     Sharp pull 3. SEVERITY: "How bad is the pain?" "What does it keep you from doing?"   (Scale 1-10; or mild, moderate, severe)   -  MILD (1-3): doesn't interfere with normal activities    -  MODERATE (4-7): interferes with normal activities (e.g., work or school) or awakens from sleep, limping    -  SEVERE (8-10): excruciating pain, unable to do any normal activities, unable to walk    After sitting then she gets up is the worse time 4. ONSET: "When did the pain start?" "Does it come and go, or is it there all the time?"     Comes and goes 5. RECURRENT: "Have you had this pain before?" If Yes, ask: "When, and what happened then?"     no 6. SETTING: "Has there been any recent work, exercise or other activity that involved that part of the body?"      Works on her feet all day  7. AGGRAVATING FACTORS: "What makes the knee pain worse?" (e.g., walking, climbing stairs, running)     Climbing stairs 8. ASSOCIATED SYMPTOMS: "Is there any swelling or redness of the knee?"     none 9. OTHER SYMPTOMS: "Do you have any other  symptoms?" (e.g., chest pain, difficulty breathing, fever, calf pain)     Denies all. 10. PREGNANCY: "Is there any chance you are pregnant?" "When was your last menstrual period?"       no  Protocols used: Knee Pain-A-AH

## 2021-11-30 ENCOUNTER — Other Ambulatory Visit: Payer: Self-pay | Admitting: Family Medicine

## 2021-12-01 ENCOUNTER — Other Ambulatory Visit: Payer: Self-pay | Admitting: Family Medicine

## 2021-12-02 NOTE — Telephone Encounter (Signed)
Requested medications are due for refill today.  unsure  Requested medications are on the active medications list.  yes  Last refill. 11/08/2020  Future visit scheduled.   no  Notes to clinic.  Pt last seen in clinic 11/08/2020. Labs are expired.    Requested Prescriptions  Pending Prescriptions Disp Refills   atorvastatin (LIPITOR) 10 MG tablet [Pharmacy Med Name: ATORVASTATIN 10 MG TABLET] 90 tablet 1    Sig: TAKE 1 TABLET BY MOUTH EVERY DAY     Cardiovascular:  Antilipid - Statins Failed - 12/01/2021  1:31 AM      Failed - Total Cholesterol in normal range and within 360 days    Cholesterol, Total  Date Value Ref Range Status  11/08/2020 168 100 - 199 mg/dL Final          Failed - LDL in normal range and within 360 days    LDL Chol Calc (NIH)  Date Value Ref Range Status  11/08/2020 98 0 - 99 mg/dL Final          Failed - HDL in normal range and within 360 days    HDL  Date Value Ref Range Status  11/08/2020 32 (L) >39 mg/dL Final          Failed - Triglycerides in normal range and within 360 days    Triglycerides  Date Value Ref Range Status  11/08/2020 219 (H) 0 - 149 mg/dL Final          Failed - Valid encounter within last 12 months    Recent Outpatient Visits           1 year ago Mixed hyperlipidemia   [facility] [place], Megan P, [place]   1 year ago Recurrent major depressive disorder, in full remission [facility])   [place], [place], [place]   2 years ago Non-seasonal allergic rhinitis due to other allergic trigger   [facility] Volney American, [place]   2 years ago Chronic anxiety   [place], [place], [place]   2 years ago Right flank pain   [place], [place], [place]              Passed - Patient is not pregnant

## 2021-12-02 NOTE — Telephone Encounter (Signed)
Requested medications are due for refill today.  unsure  Requested medications are on the active medications list.  yes  Last refill. 11/08/2020  Future visit scheduled.   no  Notes to clinic.  Pt is more tha 3 months overdue for office visit - last seen in office 11/08/2020.    Requested Prescriptions  Pending Prescriptions Disp Refills   sertraline (ZOLOFT) 100 MG tablet [Pharmacy Med Name: SERTRALINE HCL 100 MG TABLET] 90 tablet 1    Sig: TAKE 1 TABLET BY MOUTH EVERY DAY     Psychiatry:  Antidepressants - SSRI Failed - 11/30/2021  1:21 PM      Failed - Completed PHQ-2 or PHQ-9 in the last 360 days      Failed - Valid encounter within last 6 months    Recent Outpatient Visits           1 year ago Mixed hyperlipidemia   [place], [place], [place]   1 year ago Recurrent major depressive disorder, in full remission [facility])   [place], [place], [place]   2 years ago Non-seasonal allergic rhinitis due to other allergic trigger   [facility] Volney American, [place]   2 years ago Chronic anxiety   [place], [place], [place]   2 years ago Right flank pain   [place], [place], [place]

## 2021-12-03 NOTE — Telephone Encounter (Signed)
Pt scheduled for appt this Friday at 1:20

## 2021-12-05 ENCOUNTER — Ambulatory Visit: Payer: Self-pay | Admitting: Nurse Practitioner

## 2021-12-08 ENCOUNTER — Encounter: Payer: Self-pay | Admitting: Nurse Practitioner

## 2021-12-08 ENCOUNTER — Other Ambulatory Visit: Payer: Self-pay

## 2021-12-08 ENCOUNTER — Ambulatory Visit: Payer: 59 | Admitting: Nurse Practitioner

## 2021-12-08 VITALS — BP 127/71 | HR 79 | Temp 98.5°F | Ht 66.0 in | Wt 228.8 lb

## 2021-12-08 DIAGNOSIS — R69 Illness, unspecified: Secondary | ICD-10-CM | POA: Diagnosis not present

## 2021-12-08 DIAGNOSIS — F17210 Nicotine dependence, cigarettes, uncomplicated: Secondary | ICD-10-CM

## 2021-12-08 DIAGNOSIS — E559 Vitamin D deficiency, unspecified: Secondary | ICD-10-CM

## 2021-12-08 DIAGNOSIS — F3342 Major depressive disorder, recurrent, in full remission: Secondary | ICD-10-CM | POA: Diagnosis not present

## 2021-12-08 DIAGNOSIS — Z0000 Encounter for general adult medical examination without abnormal findings: Secondary | ICD-10-CM

## 2021-12-08 DIAGNOSIS — E782 Mixed hyperlipidemia: Secondary | ICD-10-CM | POA: Diagnosis not present

## 2021-12-08 DIAGNOSIS — Z1159 Encounter for screening for other viral diseases: Secondary | ICD-10-CM

## 2021-12-08 DIAGNOSIS — F419 Anxiety disorder, unspecified: Secondary | ICD-10-CM

## 2021-12-08 LAB — URINALYSIS, ROUTINE W REFLEX MICROSCOPIC
Bilirubin, UA: NEGATIVE
Glucose, UA: NEGATIVE
Ketones, UA: NEGATIVE
Leukocytes,UA: NEGATIVE
Nitrite, UA: NEGATIVE
Protein,UA: NEGATIVE
RBC, UA: NEGATIVE
Specific Gravity, UA: 1.02 (ref 1.005–1.030)
Urobilinogen, Ur: 0.2 mg/dL (ref 0.2–1.0)
pH, UA: 7 (ref 5.0–7.5)

## 2021-12-08 MED ORDER — HYDROXYZINE HCL 25 MG PO TABS: 25.0000 mg | ORAL_TABLET | Freq: Three times a day (TID) | ORAL | 1 refills | Status: DC | PRN

## 2021-12-08 MED ORDER — SERTRALINE HCL 100 MG PO TABS: 100.0000 mg | ORAL_TABLET | Freq: Every day | ORAL | 1 refills | Status: DC

## 2021-12-08 MED ORDER — ATORVASTATIN CALCIUM 10 MG PO TABS: 10.0000 mg | ORAL_TABLET | Freq: Every day | ORAL | 1 refills | Status: DC

## 2021-12-08 NOTE — Progress Notes (Signed)
Hi Adaria. It was nice to meet you today. Your urine from today looks good.  I will send you another message once the rest of your lab work comes back.

## 2021-12-08 NOTE — Progress Notes (Signed)
BP 127/71    Pulse 79    Temp 98.5 F (36.9 C) (Oral)    Ht 5\' 6"  (1.676 m)    Wt 228 lb 12.8 oz (103.8 kg)    LMP 06/26/2015 (Exact Date)    SpO2 95%    BMI 36.93 kg/m    Subjective:    Patient ID: Bianca Clay, female    DOB: 07/11/1966, 56 y.o.   MRN: [id number]  HPI: Bianca Clay is a 56 y.o. female presenting on 12/08/2021 for comprehensive medical examination. Current medical complaints include:none  She currently lives with: Menopausal Symptoms: no  DEPRESSION Patient states her mood is doing well. She uses the hydroxyzine usually once a day.  Denies concerns at visit today. Does need a refill.    Depression Screen done today and results listed below:  Depression screen [facility] 2/9 12/08/2021 11/08/2020 04/19/2020 10/18/2019 06/28/2019  Decreased Interest 0 1 0 1 0  Down, Depressed, Hopeless 0 1 0 0 0  PHQ - 2 Score 0 2 0 1 0  Altered sleeping 2 1 1 2 1   Tired, decreased energy 2 0 1 1 1   Change in appetite 0 0 0 1 0  Feeling bad or failure about yourself  0 0 0 0 0  Trouble concentrating 0 0 0 0 0  Moving slowly or fidgety/restless 0 0 0 0 0  Suicidal thoughts 0 0 0 0 0  PHQ-9 Score 4 3 2 5 2   Difficult doing work/chores Not difficult at all Not difficult at all - - -  Some recent data might be hidden    The patient does not have a history of falls. I did complete a risk assessment for falls. A plan of care for falls was documented.   Past Medical History:  Past Medical History:  Diagnosis Date   Anxiety    Cervical dysplasia    Depression    Galactorrhea not associated with childbirth 2011   DR. BALL   History of mammogram 09/19/2013   BIRAD I   History of Papanicolaou smear of cervix 03/29/2012   -/-    Surgical History:  Past Surgical History:  Procedure Laterality Date   CERVICAL CONE BIOPSY  1989   CESAREAN SECTION     X1   HYSTEROSCOPY     TONSILLECTOMY  1980   TUBAL LIGATION      Medications:  Current Outpatient Medications on File  Prior to Visit  Medication Sig   betamethasone dipropionate (DIPROLENE) 0.05 % ointment Please specify directions, refills and quantity   Cholecalciferol (VITAMIN D PO) Take 2,000 Units by mouth daily.    Multiple Vitamin (MULTIVITAMIN) capsule Take 1 capsule by mouth daily.   VENTOLIN HFA 108 (90 Base) MCG/ACT inhaler TAKE 2 PUFFS BY MOUTH EVERY 6 HOURS AS NEEDED FOR WHEEZE OR SHORTNESS OF BREATH   vitamin B-12 (CYANOCOBALAMIN) 1000 MCG tablet Take 1,000 mcg by mouth daily.   No current facility-administered medications on file prior to visit.    Allergies:  No Known Allergies  Social History:  Social History   Socioeconomic History   Marital status: Single    Spouse name: Not on file   Number of children: Not on file   Years of education: Not on file   Highest education level: Not on file  Occupational History   Not on file  Tobacco Use   Smoking status: Every Day    Packs/day: 0.50    Types: Cigarettes   Smokeless tobacco:  Never   Tobacco comments:    counseling, habit replacement  Vaping Use   Vaping Use: Never used  Substance and Sexual Activity   Alcohol use: No    Alcohol/week: 0.0 standard drinks   Drug use: No   Sexual activity: Not Currently    Birth control/protection: Other-see comments    Comment: TUBAL LIGATION  Other Topics Concern   Not on file  Social History Narrative   Not on file   Social Determinants of Health   Financial Resource Strain: Not on file  Food Insecurity: Not on file  Transportation Needs: Not on file  Physical Activity: Not on file  Stress: Not on file  Social Connections: Not on file  Intimate Partner Violence: Not on file   Social History   Tobacco Use  Smoking Status Every Day   Packs/day: 0.50   Types: Cigarettes  Smokeless Tobacco Never  Tobacco Comments   counseling, habit replacement   Social History   Substance and Sexual Activity  Alcohol Use No   Alcohol/week: 0.0 standard drinks    Family History:   Family History  Problem Relation Age of Onset   Diabetes Mother    Hypertension Mother    Diabetes Father    Hypertension Father    Depression Sister    Hypertension Sister    Allergies Son    Depression Sister    Breast cancer Neg Hx     Past medical history, surgical history, medications, allergies, family history and social history reviewed with patient today and changes made to appropriate areas of the chart.   Review of Systems  Eyes:  Negative for blurred vision and double vision.  Respiratory:  Negative for shortness of breath.   Cardiovascular:  Negative for chest pain, palpitations and leg swelling.  Neurological:  Negative for dizziness and headaches.  Psychiatric/Behavioral:  Positive for depression. Negative for suicidal ideas. The patient is nervous/anxious.   All other ROS negative except what is listed above and in the HPI.      Objective:    BP 127/71    Pulse 79    Temp 98.5 F (36.9 C) (Oral)    Ht 5\' 6"  (1.676 m)    Wt 228 lb 12.8 oz (103.8 kg)    LMP 06/26/2015 (Exact Date)    SpO2 95%    BMI 36.93 kg/m   Wt Readings from Last 3 Encounters:  12/08/21 228 lb 12.8 oz (103.8 kg)  11/08/20 225 lb 12.8 oz (102.4 kg)  04/19/20 222 lb (100.7 kg)    Physical Exam Vitals and nursing note reviewed.  Constitutional:      General: She is awake. She is not in acute distress.    Appearance: Normal appearance. She is well-developed. She is obese. She is not ill-appearing.  HENT:     Head: Normocephalic and atraumatic.     Right Ear: Hearing, tympanic membrane, ear canal and external ear normal. No drainage.     Left Ear: Hearing, tympanic membrane, ear canal and external ear normal. No drainage.     Nose: Nose normal.     Right Sinus: No maxillary sinus tenderness or frontal sinus tenderness.     Left Sinus: No maxillary sinus tenderness or frontal sinus tenderness.     Mouth/Throat:     Mouth: Mucous membranes are moist.     Pharynx: Oropharynx is clear. Uvula  midline. No pharyngeal swelling, oropharyngeal exudate or posterior oropharyngeal erythema.  Eyes:     General: Lids are  normal.        Right eye: No discharge.        Left eye: No discharge.     Extraocular Movements: Extraocular movements intact.     Conjunctiva/sclera: Conjunctivae normal.     Pupils: Pupils are equal, round, and reactive to light.     Visual Fields: Right eye visual fields normal and left eye visual fields normal.  Neck:     Thyroid: No thyromegaly.     Vascular: No carotid bruit.     Trachea: Trachea normal.  Cardiovascular:     Rate and Rhythm: Normal rate and regular rhythm.     Heart sounds: Normal heart sounds. No murmur heard.   No gallop.  Pulmonary:     Effort: Pulmonary effort is normal. No accessory muscle usage or respiratory distress.     Breath sounds: Normal breath sounds.  Chest:  Breasts:    Right: Normal.     Left: Normal.  Abdominal:     General: Bowel sounds are normal.     Palpations: Abdomen is soft. There is no hepatomegaly or splenomegaly.     Tenderness: There is no abdominal tenderness.  Musculoskeletal:        General: Normal range of motion.     Cervical back: Normal range of motion and neck supple.     Right lower leg: No edema.     Left lower leg: No edema.  Lymphadenopathy:     Head:     Right side of head: No submental, submandibular, tonsillar, preauricular or posterior auricular adenopathy.     Left side of head: No submental, submandibular, tonsillar, preauricular or posterior auricular adenopathy.     Cervical: No cervical adenopathy.     Upper Body:     Right upper body: No supraclavicular, axillary or pectoral adenopathy.     Left upper body: No supraclavicular, axillary or pectoral adenopathy.  Skin:    General: Skin is warm and dry.     Capillary Refill: Capillary refill takes less than 2 seconds.     Findings: No rash.  Neurological:     Mental Status: She is alert and oriented to person, place, and time.      Gait: Gait is intact.     Deep Tendon Reflexes: Reflexes are normal and symmetric.     Reflex Scores:      Brachioradialis reflexes are 2+ on the right side and 2+ on the left side.      Patellar reflexes are 2+ on the right side and 2+ on the left side. Psychiatric:        Attention and Perception: Attention normal.        Mood and Affect: Mood normal.        Speech: Speech normal.        Behavior: Behavior normal. Behavior is cooperative.        Thought Content: Thought content normal.        Judgment: Judgment normal.    Results for orders placed or performed in visit on 12/08/21  Urinalysis, Routine w reflex microscopic  Result Value Ref Range   Specific Gravity, UA 1.020 1.005 - 1.030   pH, UA 7.0 5.0 - 7.5   Color, UA Yellow Yellow   Appearance Ur Clear Clear   Leukocytes,UA Negative Negative   Protein,UA Negative Negative/Trace   Glucose, UA Negative Negative   Ketones, UA Negative Negative   RBC, UA Negative Negative   Bilirubin, UA Negative Negative   Urobilinogen,  Ur 0.2 0.2 - 1.0 mg/dL   Nitrite, UA Negative Negative      Assessment & Plan:   Problem List Items Addressed This Visit       Other   Vitamin D deficiency    Labs ordered during visit. Will make recommendations based on lab results.       Depression    Chronic.  Controlled.  Continue with current medication regimen on Zoloft 100mg  daily.  Labs ordered today.  Return to clinic in 6 months for reevaluation.  Call sooner if concerns arise.        Relevant Medications   hydrOXYzine (ATARAX) 25 MG tablet   sertraline (ZOLOFT) 100 MG tablet   Chronic anxiety    Chronic.  Controlled.  Continue with current medication regimen on Hydroxyzine 25mg  TID PRN.  Labs ordered today.  Return to clinic in 6 months for reevaluation.  Call sooner if concerns arise.        Relevant Medications   hydrOXYzine (ATARAX) 25 MG tablet   sertraline (ZOLOFT) 100 MG tablet   Cigarette smoker    Current everyday  smoker. Not ready to quit. Will reassess at future visits.      Hyperlipidemia    Chronic.  Controlled.  Continue with current medication regimen on Atorvastatin 10mg .  Refills sent today.  Labs ordered today.  Return to clinic in 6 months for reevaluation.  Call sooner if concerns arise.        Relevant Medications   atorvastatin (LIPITOR) 10 MG tablet   Other Visit Diagnoses     Annual physical exam    -  Primary   Health maintenance reviewed during visit today. Labs ordered. Declined vaccines at visit today.    Relevant Orders   Vitamin D (25 hydroxy)   CBC with Differential/Platelet   Comprehensive metabolic panel   Lipid panel   TSH   Urinalysis, Routine w reflex microscopic (Completed)   Encounter for hepatitis C screening test for low risk patient       Relevant Orders   Hepatitis C Antibody        Follow up plan: Return in about 1 year (around 12/08/2022) for Physical and Fasting labs.   LABORATORY TESTING:  - Pap smear: up to date  IMMUNIZATIONS:   - Tdap: Tetanus vaccination status reviewed: last tetanus booster within 10 years. - Influenza: Up to date - Pneumovax: Refused - Prevnar: Not applicable - COVID: Up to date - HPV: Not applicable - Shingrix vaccine: Refused  SCREENING: -Mammogram: Up to date  - Colonoscopy: Up to date  - Bone Density: Not applicable  -Hearing Test: Not applicable  -Spirometry: Not applicable   PATIENT COUNSELING:   Advised to take 1 mg of folate supplement per day if capable of pregnancy.   Sexuality: Discussed sexually transmitted diseases, partner selection, use of condoms, avoidance of unintended pregnancy  and contraceptive alternatives.   Advised to avoid cigarette smoking.  I discussed with the patient that most people either abstain from alcohol or drink within safe limits (<=14/week and <=4 drinks/occasion for males, <=7/weeks and <= 3 drinks/occasion for females) and that the risk for alcohol disorders and other  health effects rises proportionally with the number of drinks per week and how often a drinker exceeds daily limits.  Discussed cessation/primary prevention of drug use and availability of treatment for abuse.   Diet: Encouraged to adjust caloric intake to maintain  or achieve ideal body weight, to reduce intake of dietary saturated fat  and total fat, to limit sodium intake by avoiding high sodium foods and not adding table salt, and to maintain adequate dietary potassium and calcium preferably from fresh fruits, vegetables, and low-fat dairy products.    stressed the importance of regular exercise  Injury prevention: Discussed safety belts, safety helmets, smoke detector, smoking near bedding or upholstery.   Dental health: Discussed importance of regular tooth brushing, flossing, and dental visits.    NEXT PREVENTATIVE PHYSICAL DUE IN 1 YEAR. Return in about 1 year (around 12/08/2022) for Physical and Fasting labs.

## 2021-12-08 NOTE — Assessment & Plan Note (Signed)
Chronic.  Controlled.  Continue with current medication regimen on Hydroxyzine 25mg  TID PRN.  Labs ordered today.  Return to clinic in 6 months for reevaluation.  Call sooner if concerns arise.

## 2021-12-08 NOTE — Assessment & Plan Note (Signed)
Chronic.  Controlled.  Continue with current medication regimen on Zoloft 100mg daily.  Labs ordered today.  Return to clinic in 6 months for reevaluation.  Call sooner if concerns arise.   

## 2021-12-08 NOTE — Assessment & Plan Note (Signed)
Chronic.  Controlled.  Continue with current medication regimen on Atorvastatin 10mg .  Refills sent today.  Labs ordered today.  Return to clinic in 6 months for reevaluation.  Call sooner if concerns arise.

## 2021-12-08 NOTE — Assessment & Plan Note (Signed)
Labs ordered during visit. Will make recommendations based on lab results.

## 2021-12-08 NOTE — Assessment & Plan Note (Addendum)
Current everyday smoker. Not ready to quit. Will reassess at future visits.

## 2021-12-09 LAB — COMPREHENSIVE METABOLIC PANEL
ALT: 19 IU/L (ref 0–32)
AST: 21 IU/L (ref 0–40)
Albumin/Globulin Ratio: 2.1 (ref 1.2–2.2)
Albumin: 4.5 g/dL (ref 3.8–4.9)
Alkaline Phosphatase: 117 IU/L (ref 44–121)
BUN/Creatinine Ratio: 16 (ref 9–23)
BUN: 11 mg/dL (ref 6–24)
Bilirubin Total: <0.2 mg/dL (ref 0.0–1.2)
CO2: 24 mmol/L (ref 20–29)
Calcium: 10 mg/dL (ref 8.7–10.2)
Chloride: 103 mmol/L (ref 96–106)
Creatinine, Ser: 0.67 mg/dL (ref 0.57–1.00)
Globulin, Total: 2.1 g/dL (ref 1.5–4.5)
Glucose: 86 mg/dL (ref 70–99)
Potassium: 4.3 mmol/L (ref 3.5–5.2)
Sodium: 139 mmol/L (ref 134–144)
Total Protein: 6.6 g/dL (ref 6.0–8.5)
eGFR: 104 mL/min/{1.73_m2} (ref >59)

## 2021-12-09 LAB — CBC WITH DIFFERENTIAL/PLATELET
Basophils Absolute: 0 10*3/uL (ref 0.0–0.2)
Basos: 0 %
EOS (ABSOLUTE): 0.3 10*3/uL (ref 0.0–0.4)
Eos: 3 %
Hematocrit: 40.1 % (ref 34.0–46.6)
Hemoglobin: 13.4 g/dL (ref 11.1–15.9)
Immature Grans (Abs): 0 10*3/uL (ref 0.0–0.1)
Immature Granulocytes: 0 %
Lymphocytes Absolute: 3.9 10*3/uL — ABNORMAL HIGH (ref 0.7–3.1)
Lymphs: 41 %
MCH: 29.1 pg (ref 26.6–33.0)
MCHC: 33.4 g/dL (ref 31.5–35.7)
MCV: 87 fL (ref 79–97)
Monocytes Absolute: 0.5 10*3/uL (ref 0.1–0.9)
Monocytes: 6 %
Neutrophils Absolute: 4.8 10*3/uL (ref 1.4–7.0)
Neutrophils: 50 %
Platelets: 391 10*3/uL (ref 150–450)
RBC: 4.61 x10E6/uL (ref 3.77–5.28)
RDW: 13.4 % (ref 11.7–15.4)
WBC: 9.6 10*3/uL (ref 3.4–10.8)

## 2021-12-09 LAB — HEPATITIS C ANTIBODY: Hep C Virus Ab: <0.1 s/co ratio (ref 0.0–0.9)

## 2021-12-09 LAB — TSH: TSH: 2.49 u[IU]/mL (ref 0.450–4.500)

## 2021-12-09 LAB — LIPID PANEL
Chol/HDL Ratio: 5.3 ratio — ABNORMAL HIGH (ref 0.0–4.4)
Cholesterol, Total: 187 mg/dL (ref 100–199)
HDL: 35 mg/dL — ABNORMAL LOW (ref >39)
LDL Chol Calc (NIH): 112 mg/dL — ABNORMAL HIGH (ref 0–99)
Triglycerides: 231 mg/dL — ABNORMAL HIGH (ref 0–149)
VLDL Cholesterol Cal: 40 mg/dL (ref 5–40)

## 2021-12-09 LAB — VITAMIN D 25 HYDROXY (VIT D DEFICIENCY, FRACTURES): Vit D, 25-Hydroxy: 36.1 ng/mL (ref 30.0–100.0)

## 2021-12-09 NOTE — Progress Notes (Signed)
Hi Bianca Clay.  It was nice to meet you yesterday. Your vitamin D is within normal range.  You can continue with your vitamin D supplement.  Your cholesterol is high. I recommend continuing with your atorvastatin and follow a low fat diet. Other lab work looks good.

## 2022-06-04 ENCOUNTER — Ambulatory Visit: Payer: 59 | Admitting: Physician Assistant

## 2022-06-04 NOTE — Progress Notes (Deleted)
          Acute Office Visit   Patient: Bianca Clay   DOB: 1966-07-04   56 y.o. Female  MRN: [id number] Visit Date: 06/04/2022  Today's healthcare provider: Dani Gobble Gaynel Schaafsma, PA-C  Introduced myself to the patient as a Journalist, newspaper and provided education on APPs in clinical practice.    No chief complaint on file.  Subjective    HPI    Medications: Outpatient Medications Prior to Visit  Medication Sig   atorvastatin (LIPITOR) 10 MG tablet Take 1 tablet (10 mg total) by mouth daily.   betamethasone dipropionate (DIPROLENE) 0.05 % ointment Please specify directions, refills and quantity   Cholecalciferol (VITAMIN D PO) Take 2,000 Units by mouth daily.    hydrOXYzine (ATARAX) 25 MG tablet Take 1 tablet (25 mg total) by mouth 3 (three) times daily as needed.   Multiple Vitamin (MULTIVITAMIN) capsule Take 1 capsule by mouth daily.   sertraline (ZOLOFT) 100 MG tablet Take 1 tablet (100 mg total) by mouth daily.   VENTOLIN HFA 108 (90 Base) MCG/ACT inhaler TAKE 2 PUFFS BY MOUTH EVERY 6 HOURS AS NEEDED FOR WHEEZE OR SHORTNESS OF BREATH   vitamin B-12 (CYANOCOBALAMIN) 1000 MCG tablet Take 1,000 mcg by mouth daily.   No facility-administered medications prior to visit.    Review of Systems  {Labs  Heme  Chem  Endocrine  Serology  Results Review (optional):23779}   Objective    LMP 03/15/2015  {Show previous vital signs (optional):23777}  Physical Exam    No results found for any visits on 06/04/22.  Assessment & Plan      No follow-ups on file.

## 2022-06-09 ENCOUNTER — Ambulatory Visit (INDEPENDENT_AMBULATORY_CARE_PROVIDER_SITE_OTHER): Payer: 59 | Admitting: Unknown Physician Specialty

## 2022-06-09 ENCOUNTER — Other Ambulatory Visit (HOSPITAL_COMMUNITY)
Admission: RE | Admit: 2022-06-09 | Discharge: 2022-06-09 | Disposition: A | Discharge status: Home / Self Care | Payer: 59 | Source: Ambulatory Visit | Attending: Physician Assistant | Admitting: Physician Assistant

## 2022-06-09 ENCOUNTER — Encounter: Payer: Self-pay | Admitting: Unknown Physician Specialty

## 2022-06-09 VITALS — BP 109/74 | HR 76 | Temp 97.9°F | Ht 65.98 in | Wt 229.6 lb

## 2022-06-09 DIAGNOSIS — L989 Disorder of the skin and subcutaneous tissue, unspecified: Secondary | ICD-10-CM | POA: Insufficient documentation

## 2022-06-09 DIAGNOSIS — I872 Venous insufficiency (chronic) (peripheral): Secondary | ICD-10-CM | POA: Diagnosis not present

## 2022-06-09 DIAGNOSIS — L986 Other infiltrative disorders of the skin and subcutaneous tissue: Secondary | ICD-10-CM | POA: Diagnosis not present

## 2022-06-09 MED ORDER — BETAMETHASONE DIPROPIONATE 0.05 % EX OINT: TOPICAL_OINTMENT | CUTANEOUS | 1 refills | Status: DC

## 2022-06-09 NOTE — Progress Notes (Signed)
BP 109/74   Pulse 76   Temp 97.9 F (36.6 C) (Oral)   Ht 5' 5.98" (1.676 m)   Wt 229 lb 9.6 oz (104.1 kg)   LMP 03/15/2015   SpO2 96%   BMI 37.08 kg/m    Subjective:    Patient ID: Bianca Clay, female    DOB: 1966-01-31, 56 y.o.   MRN: [id number]  HPI: Bianca Clay is a 56 y.o. female  Chief Complaint  Patient presents with   spot on shin    Right Vernard Gambles first noticed a few weeks ago, has been putting an antifungal on it.   Skin lesion- Pt with lesion right shin for several weeks.  OTC anti-fungal dries it out but hasn't resolved.  No itching, burning, or pain  Relevant past medical, surgical, family and social history reviewed and updated as indicated. Interim medical history since our last visit reviewed. Allergies and medications reviewed and updated.  Review of Systems  Per HPI unless specifically indicated above     Objective:    BP 109/74   Pulse 76   Temp 97.9 F (36.6 C) (Oral)   Ht 5' 5.98" (1.676 m)   Wt 229 lb 9.6 oz (104.1 kg)   LMP 03/15/2015   SpO2 96%   BMI 37.08 kg/m   Wt Readings from Last 3 Encounters:  06/09/22 229 lb 9.6 oz (104.1 kg)  12/08/21 228 lb 12.8 oz (103.8 kg)  11/08/20 225 lb 12.8 oz (102.4 kg)    Physical Exam Constitutional:      General: She is not in acute distress.    Appearance: Normal appearance. She is well-developed.  HENT:     Head: Normocephalic and atraumatic.  Eyes:     General: Lids are normal. No scleral icterus.       Right eye: No discharge.        Left eye: No discharge.     Conjunctiva/sclera: Conjunctivae normal.  Cardiovascular:     Rate and Rhythm: Normal rate.  Pulmonary:     Effort: Pulmonary effort is normal.  Abdominal:     Palpations: There is no hepatomegaly or splenomegaly.  Musculoskeletal:        General: Normal range of motion.  Skin:    Coloration: Skin is not pale.     Findings: No rash.     Comments: Dime sized lesion right anterior shin.    Neurological:     Mental  Status: She is alert and oriented to person, place, and time.  Psychiatric:        Behavior: Behavior normal.        Thought Content: Thought content normal.        Judgment: Judgment normal.    After informed consent and pt ed on scarring, area infiltrated with lidocaine with Epinephrine.  Part of the dermis shaved off with # 15 blade.  Sample sent for pathology.  Area cauterized with silver nitrate stick.   Pt tolerated the procedure well  Results for orders placed or performed in visit on 12/08/21  Vitamin D (25 hydroxy)  Result Value Ref Range   Vit D, 25-Hydroxy 36.1 30.0 - 100.0 ng/mL  CBC with Differential/Platelet  Result Value Ref Range   WBC 9.6 3.4 - 10.8 x10E3/uL   RBC 4.61 3.77 - 5.28 x10E6/uL   Hemoglobin 13.4 11.1 - 15.9 g/dL   Hematocrit 40.1 34.0 - 46.6 %   MCV 87 79 - 97 fL   MCH 29.1 26.6 -  33.0 pg   MCHC 33.4 31.5 - 35.7 g/dL   RDW 13.4 11.7 - 15.4 %   Platelets 391 150 - 450 x10E3/uL   Neutrophils 50 Not Estab. %   Lymphs 41 Not Estab. %   Monocytes 6 Not Estab. %   Eos 3 Not Estab. %   Basos 0 Not Estab. %   Neutrophils Absolute 4.8 1.4 - 7.0 x10E3/uL   Lymphocytes Absolute 3.9 (H) 0.7 - 3.1 x10E3/uL   Monocytes Absolute 0.5 0.1 - 0.9 x10E3/uL   EOS (ABSOLUTE) 0.3 0.0 - 0.4 x10E3/uL   Basophils Absolute 0.0 0.0 - 0.2 x10E3/uL   Immature Granulocytes 0 Not Estab. %   Immature Grans (Abs) 0.0 0.0 - 0.1 x10E3/uL  Comprehensive metabolic panel  Result Value Ref Range   Glucose 86 70 - 99 mg/dL   BUN 11 6 - 24 mg/dL   Creatinine, Ser 0.67 0.57 - 1.00 mg/dL   eGFR 104 >59 mL/min/1.73   BUN/Creatinine Ratio 16 9 - 23   Sodium 139 134 - 144 mmol/L   Potassium 4.3 3.5 - 5.2 mmol/L   Chloride 103 96 - 106 mmol/L   CO2 24 20 - 29 mmol/L   Calcium 10.0 8.7 - 10.2 mg/dL   Total Protein 6.6 6.0 - 8.5 g/dL   Albumin 4.5 3.8 - 4.9 g/dL   Globulin, Total 2.1 1.5 - 4.5 g/dL   Albumin/Globulin Ratio 2.1 1.2 - 2.2   Bilirubin Total <0.2 0.0 - 1.2 mg/dL    Alkaline Phosphatase 117 44 - 121 IU/L   AST 21 0 - 40 IU/L   ALT 19 0 - 32 IU/L  Lipid panel  Result Value Ref Range   Cholesterol, Total 187 100 - 199 mg/dL   Triglycerides 231 (H) 0 - 149 mg/dL   HDL 35 (L) >39 mg/dL   VLDL Cholesterol Cal 40 5 - 40 mg/dL   LDL Chol Calc (NIH) 112 (H) 0 - 99 mg/dL   Chol/HDL Ratio 5.3 (H) 0.0 - 4.4 ratio  TSH  Result Value Ref Range   TSH 2.490 0.450 - 4.500 uIU/mL  Urinalysis, Routine w reflex microscopic  Result Value Ref Range   Specific Gravity, UA 1.020 1.005 - 1.030   pH, UA 7.0 5.0 - 7.5   Color, UA Yellow Yellow   Appearance Ur Clear Clear   Leukocytes,UA Negative Negative   Protein,UA Negative Negative/Trace   Glucose, UA Negative Negative   Ketones, UA Negative Negative   RBC, UA Negative Negative   Bilirubin, UA Negative Negative   Urobilinogen, Ur 0.2 0.2 - 1.0 mg/dL   Nitrite, UA Negative Negative  Hepatitis C Antibody  Result Value Ref Range   Hep C Virus Ab <0.1 0.0 - 0.9 s/co ratio      Assessment & Plan:   Problem List Items Addressed This Visit   None Visit Diagnoses     Skin lesion of right leg    -  Primary   Relevant Orders   Surgical pathology        Follow up plan: Return if symptoms worsen or fail to improve.

## 2022-06-15 LAB — SURGICAL PATHOLOGY

## 2022-09-09 ENCOUNTER — Other Ambulatory Visit: Payer: Self-pay | Admitting: Unknown Physician Specialty

## 2022-09-09 DIAGNOSIS — Z1231 Encounter for screening mammogram for malignant neoplasm of breast: Secondary | ICD-10-CM

## 2022-09-19 DIAGNOSIS — Z6836 Body mass index (BMI) 36.0-36.9, adult: Secondary | ICD-10-CM | POA: Diagnosis not present

## 2022-09-19 DIAGNOSIS — L740 Miliaria rubra: Secondary | ICD-10-CM | POA: Diagnosis not present

## 2022-10-09 ENCOUNTER — Ambulatory Visit
Admission: RE | Admit: 2022-10-09 | Discharge: 2022-10-09 | Disposition: A | Discharge status: Home / Self Care | Payer: 59 | Source: Ambulatory Visit | Attending: Unknown Physician Specialty | Admitting: Unknown Physician Specialty

## 2022-10-09 DIAGNOSIS — Z1231 Encounter for screening mammogram for malignant neoplasm of breast: Secondary | ICD-10-CM | POA: Diagnosis not present

## 2022-10-14 NOTE — Progress Notes (Signed)
Contacted via MyChart   Normal mammogram, may repeat in one year:)

## 2022-10-20 ENCOUNTER — Encounter: Payer: Self-pay | Admitting: Physician Assistant

## 2022-10-20 ENCOUNTER — Ambulatory Visit: Payer: 59 | Admitting: Physician Assistant

## 2022-10-20 VITALS — BP 162/75 | HR 86 | Temp 98.3°F

## 2022-10-20 DIAGNOSIS — R21 Rash and other nonspecific skin eruption: Secondary | ICD-10-CM

## 2022-10-20 MED ORDER — PREDNISONE 20 MG PO TABS: ORAL_TABLET | ORAL | 0 refills | Status: DC

## 2022-10-20 NOTE — Patient Instructions (Addendum)
Please take the Hydroxyzine as needed up to every 8 hours- this can make you drowsy so be cautious after taking it if you will need to drive  I have sent in a script for Prednisone to help calm this down- please take this as directed Take in the AM as it can cause sleeplessness. It can also cause irritability and increased hunger so be mindful of that  You can use oatmeal baths, Calamine lotion, Cortisone cream and/or benadryl cream as needed to help reduce the itching

## 2022-10-20 NOTE — Progress Notes (Signed)
Acute Office Visit   Patient: Bianca Clay   DOB: 11-15-1966   56 y.o. Female  MRN: [id number] Visit Date: 10/20/2022  Today's healthcare provider: Dani Gobble Snyder Colavito, PA-C  Introduced myself to the patient as a Journalist, newspaper and provided education on APPs in clinical practice.    Chief Complaint  Patient presents with   Rash    On multiple areas on her body, L thigh, chest, abdomen,. Denies pain.    Subjective    Rash   HPI     Rash    Additional comments: On multiple areas on her body, L thigh, chest, abdomen,. Denies pain.       Last edited by Olene Craven, Carolynn Serve, CMA on 10/20/2022 11:31 AM.        [facility] Prom Duration:  weeks - about a month  Location: generalized, trunk, hands, arms, and legs  Itching: yes Burning: no Redness: yes Oozing: no Scaling: no Blisters: no Painful: no Fevers: no Change in detergents/soaps/personal care products: no uses Gain and Dove as usual products  Recent illness: no Recent travel:no History of same: no Context: worse Alleviating factors: itching cream from [facility] for 14 days  Treatments attempted:nothing Shortness of breath: no  Throat/tongue swelling: no Myalgias/arthralgias: no  Denies other members of household having similar symptoms, rash or itching    Medications: Outpatient Medications Prior to Visit  Medication Sig   atorvastatin (LIPITOR) 10 MG tablet Take 1 tablet (10 mg total) by mouth daily.   betamethasone dipropionate (DIPROLENE) 0.05 % ointment Please specify directions, refills and quantity   Cholecalciferol (VITAMIN D PO) Take 2,000 Units by mouth daily.    hydrOXYzine (ATARAX) 25 MG tablet Take 1 tablet (25 mg total) by mouth 3 (three) times daily as needed.   Multiple Vitamin (MULTIVITAMIN) capsule Take 1 capsule by mouth daily.   sertraline (ZOLOFT) 100 MG tablet Take 1 tablet (100 mg total) by mouth daily.   VENTOLIN HFA 108 (90 Base) MCG/ACT inhaler TAKE 2 PUFFS BY MOUTH EVERY 6 HOURS AS  NEEDED FOR WHEEZE OR SHORTNESS OF BREATH   vitamin B-12 (CYANOCOBALAMIN) 1000 MCG tablet Take 1,000 mcg by mouth daily.   No facility-administered medications prior to visit.    Review of Systems  Skin:  Positive for rash.       Objective    BP (!) 162/75   Pulse 86   Temp 98.3 F (36.8 C) (Oral)   LMP 03/15/2015   SpO2 98%    Physical Exam Vitals reviewed.  Constitutional:      General: She is awake.     Appearance: Normal appearance. She is well-developed and well-groomed.  HENT:     Head: Normocephalic and atraumatic.  Pulmonary:     Effort: Pulmonary effort is normal.  Skin:    General: Skin is warm.     Findings: Rash present. Rash is macular and papular. Rash is not crusting, scaling or vesicular.     Comments: Erythematous maculo-papular rash along hands, thigh, arms, and chest   Neurological:     General: No focal deficit present.     Mental Status: She is alert and oriented to person, place, and time.  Psychiatric:        Mood and Affect: Mood normal.        Behavior: Behavior normal. Behavior is cooperative.        Thought Content: Thought content normal.        Judgment: Judgment  normal.       No results found for any visits on 10/20/22.  Assessment & Plan      No follow-ups on file.       Problem List Items Addressed This Visit   None Visit Diagnoses     Rash in adult    -  Primary Acute, new concern, ongoing  Reports generalized maculopapular lesions - does not appear to be from bites and she denies similar symptoms in other members of household  Suspect this is likely allergic reaction at this time Encouraged her to use sensitive skin formulation for detergents and soaps Will start Prednisone taper to assist with itching Recommend she resume using Hydroxyzine for assistance with itching  Can use topicals as needed for further relief  Follow up as needed for persistent or progressing symptoms.     Relevant Medications   predniSONE  (DELTASONE) 20 MG tablet        No follow-ups on file.   I, Shareka Casale E Addysen Louth, PA-C, have reviewed all documentation for this visit. The documentation on 10/20/22 for the exam, diagnosis, procedures, and orders are all accurate and complete.   Talitha Givens, MHS, PA-C [facility]

## 2022-11-11 ENCOUNTER — Ambulatory Visit: Payer: Self-pay

## 2022-11-11 NOTE — Telephone Encounter (Signed)
  Chief Complaint: rash Symptoms: rash on LLE, abdomen and behind ears, LLE has black spots and is painful, itching Frequency: ongoing since Nov Pertinent Negatives: NA Disposition: '[]'$ ED /'[]'$ Urgent Care (no appt availability in office) / '[x]'$ Appointment(In office/virtual)/ '[]'$  [place] [facility]/ '[]'$ Home Care/ '[]'$ Refused Recommended Disposition /'[]'$ [place] [facility]/ '[]'$  Follow-up with PCP Additional Notes: pt had OV on 10/20/22 was given prednisone and got better some but then came back. Pt has been using calamine lotion and not helping either. Recommended coming back in and scheduled appt for tomorrow at 1440 with [place], [place].   Reason for Disposition  Localized rash present > 7 days  Answer Assessment - Initial Assessment Questions 1. APPEARANCE of RASH: "Describe the rash."      Red bumps  2. LOCATION: "Where is the rash located?"      LLE with black spots, abdomen, ear  5. ONSET: "When did the rash start?"      Ongoing since Nov  6. ITCHING: "Does the rash itch?" If Yes, ask: "How bad is the itch?"  (Scale 0-10; or none, mild, moderate, severe)     Moderate  7. PAIN: "Does the rash hurt?" If Yes, ask: "How bad is the pain?"  (Scale 0-10; or none, mild, moderate, severe)    - NONE (0): no pain    - MILD (1-3): doesn't interfere with normal activities     - MODERATE (4-7): interferes with normal activities or awakens from sleep     - SEVERE (8-10): excruciating pain, unable to do any normal activities     Moderate  8. OTHER SYMPTOMS: "Do you have any other symptoms?" (e.g., fever)  Protocols used: Rash or Redness - Localized-A-AH

## 2022-11-12 ENCOUNTER — Encounter: Payer: Self-pay | Admitting: Physician Assistant

## 2022-11-12 ENCOUNTER — Ambulatory Visit: Payer: 59 | Admitting: Physician Assistant

## 2022-11-12 VITALS — BP 129/79 | HR 83 | Temp 98.4°F | Ht 65.98 in | Wt 221.7 lb

## 2022-11-12 DIAGNOSIS — R21 Rash and other nonspecific skin eruption: Secondary | ICD-10-CM | POA: Diagnosis not present

## 2022-11-12 MED ORDER — PREDNISONE 10 MG PO TABS: ORAL_TABLET | ORAL | 0 refills | Status: DC

## 2022-11-12 MED ORDER — GABAPENTIN 100 MG PO CAPS: 100.0000 mg | ORAL_CAPSULE | Freq: Three times a day (TID) | ORAL | 0 refills | Status: DC

## 2022-11-12 NOTE — Progress Notes (Signed)
Acute Office Visit   Patient: Bianca Clay   DOB: May 04, 1966   56 y.o. Female  MRN: [id number] Visit Date: 11/12/2022  Today's healthcare provider: Dani Gobble Raygen Linquist, PA-C  Introduced myself to the patient as a Journalist, newspaper and provided education on APPs in clinical practice.    Chief Complaint  Patient presents with   Rash    Was seen 11/21 for rash, still having itching.   Subjective    Rash   HPI     Rash    Additional comments: Was seen 11/21 for rash, still having itching.      Last edited by Jerelene Redden, CMA on 11/12/2022  2:42 PM.       Rash and itching Onset: gradual  Duration: weeks  Associated symptoms: Rash has several isolated lesions with scabbing along LLE- reports these are very painful if they are bumped or touched  Reports disseminated rash along abdomen and back  Alleviating: Prednisone provided some relief  Aggravating: nothing  Interventions: was given Prednisone taper on 10/20/22, Calamine lotion, Claritin for about 3 days   She has switched her soaps and detergents to fragrance free, dye free formulations She is still using Dove soap   Medications: Outpatient Medications Prior to Visit  Medication Sig   atorvastatin (LIPITOR) 10 MG tablet Take 1 tablet (10 mg total) by mouth daily.   Cholecalciferol (VITAMIN D PO) Take 2,000 Units by mouth daily.    hydrOXYzine (ATARAX) 25 MG tablet Take 1 tablet (25 mg total) by mouth 3 (three) times daily as needed.   Multiple Vitamin (MULTIVITAMIN) capsule Take 1 capsule by mouth daily.   sertraline (ZOLOFT) 100 MG tablet Take 1 tablet (100 mg total) by mouth daily.   betamethasone dipropionate (DIPROLENE) 0.05 % ointment Please specify directions, refills and quantity (Patient not taking: Reported on 11/12/2022)   VENTOLIN HFA 108 (90 Base) MCG/ACT inhaler TAKE 2 PUFFS BY MOUTH EVERY 6 HOURS AS NEEDED FOR WHEEZE OR SHORTNESS OF BREATH (Patient not taking: Reported on 11/12/2022)    [DISCONTINUED] predniSONE (DELTASONE) 20 MG tablet Take '60mg'$  PO daily x 2 days, then'40mg'$  PO daily x 2 days, then '20mg'$  PO daily x 3 days   [DISCONTINUED] vitamin B-12 (CYANOCOBALAMIN) 1000 MCG tablet Take 1,000 mcg by mouth daily.   No facility-administered medications prior to visit.    Review of Systems  Skin:  Positive for rash.       Objective    BP 129/79   Pulse 83   Temp 98.4 F (36.9 C) (Oral)   Ht 5' 5.98" (1.676 m)   Wt 221 lb 11.2 oz (100.6 kg)   LMP 03/15/2015   SpO2 96%   BMI 35.80 kg/m    Physical Exam Vitals reviewed.  Constitutional:      General: She is awake.     Appearance: Normal appearance. She is well-developed and well-groomed.  Pulmonary:     Effort: Pulmonary effort is normal.  Skin:    Findings: Rash present.          Comments: Mild macular rash present along abdomen, shoulders  No isolated lesions or scabbing   Neurological:     Mental Status: She is alert.  Psychiatric:        Behavior: Behavior is cooperative.       No results found for any visits on 11/12/22.  Assessment & Plan      No follow-ups on file.  Problem List Items Addressed This Visit   None Visit Diagnoses     Rash in adult    -  Primary Acute, ongoing She reports rash on LLE developed vesicular lesions a few days after her last apt and then became scabbed like it is today Reports continued itching and skin dryness since last apt with moderate improvement while she was taking Prednisone Will place her on extended Prednisone taper - 21 day taper to prevent rebound Unsure if the rash on her leg was shingles so will provide Gabapentin for potential nerve pain due to reported tenderness and discomfort. Recommend she continue to take Claritin and use Hydroxyzine at night to assist with itching Follow up as needed for persistent or progressing symptoms    Relevant Medications   predniSONE (DELTASONE) 10 MG tablet   gabapentin (NEURONTIN) 100 MG capsule         No follow-ups on file.   I, Trequan Marsolek E Mavrik Bynum, PA-C, have reviewed all documentation for this visit. The documentation on 11/12/22 for the exam, diagnosis, procedures, and orders are all accurate and complete.   Talitha Givens, MHS, PA-C [facility]

## 2022-11-12 NOTE — Patient Instructions (Addendum)
Please continue to take the Claritin - take this for at least 4 weeks to help with the itching and potential allergy component  Please take the Prednisone as instructed- this is a longer taper so it should help with the itching as well  Please make sure to keep your skin moisturized with plenty of lotion and ointments like Aquaphor and Vaseline   Please use the Hydroxyzine when you can to help reduce itching further.

## 2022-11-27 ENCOUNTER — Ambulatory Visit: Payer: Self-pay

## 2022-11-27 MED ORDER — ALBUTEROL SULFATE HFA 108 (90 BASE) MCG/ACT IN AERS: 2.0000 | INHALATION_SPRAY | Freq: Four times a day (QID) | RESPIRATORY_TRACT | 0 refills | Status: DC | PRN

## 2022-11-27 NOTE — Telephone Encounter (Signed)
Computer won't let me e-prescribe. Please call in albuterol.

## 2022-11-27 NOTE — Telephone Encounter (Signed)
    Chief Complaint: Cough, wheezing. Albuterol inhaler has expired, asking for refill. Symptoms: Above Frequency: 12.24.23 Pertinent Negatives: Patient denies fever Disposition: '[]'$ ED /'[]'$ Urgent Care (no appt availability in office) / '[]'$ Appointment(In office/virtual)/ '[]'$  [place] [facility]/ '[]'$ Home Care/ '[]'$ Refused Recommended Disposition /'[]'$ [place] [facility]/ '[x]'$  Follow-up with PCP Additional Notes:  Please advise pt. Answer Assessment - Initial Assessment Questions 1. ONSET: "When did the cough begin?"      11/22/22 2. SEVERITY: "How bad is the cough today?"      Severe 3. SPUTUM: "Describe the color of your sputum" (none, dry cough; clear, white, yellow, green)     Clear 4. HEMOPTYSIS: "Are you coughing up any blood?" If so ask: "How much?" (flecks, streaks, tablespoons, etc.)     No 5. DIFFICULTY BREATHING: "Are you having difficulty breathing?" If Yes, ask: "How bad is it?" (e.g., mild, moderate, severe)    - MILD: No SOB at rest, mild SOB with walking, speaks normally in sentences, can lie down, no retractions, pulse < 100.    - MODERATE: SOB at rest, SOB with minimal exertion and prefers to sit, cannot lie down flat, speaks in phrases, mild retractions, audible wheezing, pulse 100-120.    - SEVERE: Very SOB at rest, speaks in single words, struggling to breathe, sitting hunched forward, retractions, pulse > 120      No 6. FEVER: "Do you have a fever?" If Yes, ask: "What is your temperature, how was it measured, and when did it start?"     No 7. CARDIAC HISTORY: "Do you have any history of heart disease?" (e.g., heart attack, congestive heart failure)      No 8. LUNG HISTORY: "Do you have any history of lung disease?"  (e.g., pulmonary embolus, asthma, emphysema)     No 9. PE RISK FACTORS: "Do you have a history of blood clots?" (or: recent major surgery, recent prolonged travel, bedridden)     No 10. OTHER SYMPTOMS: "Do you have any other symptoms?" (e.g., runny nose,  wheezing, chest pain)       Wheezing 11. PREGNANCY: "Is there any chance you are pregnant?" "When was your last menstrual period?"       No 12. TRAVEL: "Have you traveled out of the country in the last month?" (e.g., travel history, exposures)       NO  Protocols used: Cough - Acute Productive-A-AH

## 2022-11-27 NOTE — Telephone Encounter (Signed)
Prescription was phone into [facility]. Pharmacy tech verbalized understanding.

## 2022-11-27 NOTE — Telephone Encounter (Signed)
If availibility please get her in sooner

## 2022-12-09 ENCOUNTER — Encounter: Payer: Self-pay | Admitting: Nurse Practitioner

## 2022-12-09 ENCOUNTER — Ambulatory Visit (INDEPENDENT_AMBULATORY_CARE_PROVIDER_SITE_OTHER): Payer: 59 | Admitting: Nurse Practitioner

## 2022-12-09 VITALS — BP 117/80 | HR 76 | Temp 98.2°F | Wt 223.4 lb

## 2022-12-09 DIAGNOSIS — E559 Vitamin D deficiency, unspecified: Secondary | ICD-10-CM | POA: Diagnosis not present

## 2022-12-09 DIAGNOSIS — E782 Mixed hyperlipidemia: Secondary | ICD-10-CM

## 2022-12-09 DIAGNOSIS — E669 Obesity, unspecified: Secondary | ICD-10-CM | POA: Diagnosis not present

## 2022-12-09 DIAGNOSIS — F3342 Major depressive disorder, recurrent, in full remission: Secondary | ICD-10-CM | POA: Diagnosis not present

## 2022-12-09 DIAGNOSIS — R69 Illness, unspecified: Secondary | ICD-10-CM | POA: Diagnosis not present

## 2022-12-09 DIAGNOSIS — F17210 Nicotine dependence, cigarettes, uncomplicated: Secondary | ICD-10-CM

## 2022-12-09 DIAGNOSIS — Z0000 Encounter for general adult medical examination without abnormal findings: Secondary | ICD-10-CM

## 2022-12-09 DIAGNOSIS — Z1211 Encounter for screening for malignant neoplasm of colon: Secondary | ICD-10-CM

## 2022-12-09 LAB — URINALYSIS, ROUTINE W REFLEX MICROSCOPIC
Bilirubin, UA: NEGATIVE
Glucose, UA: NEGATIVE
Ketones, UA: NEGATIVE
Leukocytes,UA: NEGATIVE
Nitrite, UA: NEGATIVE
RBC, UA: NEGATIVE
Specific Gravity, UA: 1.015 (ref 1.005–1.030)
Urobilinogen, Ur: 2 mg/dL — ABNORMAL HIGH (ref 0.2–1.0)
pH, UA: 7 (ref 5.0–7.5)

## 2022-12-09 NOTE — Assessment & Plan Note (Signed)
Currently smoking 0.5 ppd.

## 2022-12-09 NOTE — Assessment & Plan Note (Signed)
Recommended eating smaller high protein, low fat meals more frequently and exercising 30 mins a day 5 times a week with a goal of 10-15lb weight loss in the next 3 months.  

## 2022-12-09 NOTE — Assessment & Plan Note (Signed)
Chronic.  Controlled.  Continue with current medication regimen on Atorvastatin '10mg'$ .  Labs ordered today.  Return to clinic in 6 months for reevaluation.  Call sooner if concerns arise.

## 2022-12-09 NOTE — Assessment & Plan Note (Signed)
Labs ordered at visit today.  Will make recommendations based on lab results.   

## 2022-12-09 NOTE — Progress Notes (Signed)
BP 117/80   Pulse 76   Temp 98.2 F (36.8 C) (Oral)   Wt 223 lb 6.4 oz (101.3 kg)   LMP 03/15/2015   SpO2 98%   BMI 36.08 kg/m    Subjective:    Patient ID: Bianca Clay, female    DOB: 1966/05/29, 57 y.o.   MRN: [id number]  HPI: Bianca Clay is a 57 y.o. female presenting on 12/09/2022 for comprehensive medical examination. Current medical complaints include:none  She currently lives with: Menopausal Symptoms: no  DEPRESSION Patient states her mood is pretty good. She uses the hydroxyzine on occasion.    She feels like the zoloft is still working well for her.  Denies concerns at visit today. Does need a refill.    Depression Screen done today and results listed below:     12/09/2022    8:19 AM 10/20/2022   11:31 AM 06/09/2022    8:10 AM 12/08/2021   11:04 AM 11/08/2020    9:38 AM  Depression screen PHQ 2/9  Decreased Interest 0 1 1 0 1  Down, Depressed, Hopeless 0 1 1 0 1  PHQ - 2 Score 0 2 2 0 2  Altered sleeping '1 1 3 2 1  '$ Tired, decreased energy 0 '2 3 2 '$ 0  Change in appetite 0 1 1 0 0  Feeling bad or failure about yourself  0 0 1 0 0  Trouble concentrating 0 0 0 0 0  Moving slowly or fidgety/restless 0 0 0 0 0  Suicidal thoughts 0 0 0 0 0  PHQ-9 Score '1 6 10 4 3  '$ Difficult doing work/chores Not difficult at all Not difficult at all Not difficult at all Not difficult at all Not difficult at all    The patient does not have a history of falls. I did complete a risk assessment for falls. A plan of care for falls was documented.   Past Medical History:  Past Medical History:  Diagnosis Date   Anxiety    Cervical dysplasia    Depression    Galactorrhea not associated with childbirth 2011   DR. BALL   History of mammogram 09/19/2013   BIRAD I   History of Papanicolaou smear of cervix 03/29/2012   -/-    Surgical History:  Past Surgical History:  Procedure Laterality Date   CERVICAL CONE BIOPSY  1989   CESAREAN SECTION     X1   HYSTEROSCOPY      TONSILLECTOMY  1980   TUBAL LIGATION      Medications:  Current Outpatient Medications on File Prior to Visit  Medication Sig   albuterol (VENTOLIN HFA) 108 (90 Base) MCG/ACT inhaler Inhale 2 puffs into the lungs every 6 (six) hours as needed for wheezing or shortness of breath.   atorvastatin (LIPITOR) 10 MG tablet Take 1 tablet (10 mg total) by mouth daily.   betamethasone dipropionate (DIPROLENE) 0.05 % ointment Please specify directions, refills and quantity   Cholecalciferol (VITAMIN D PO) Take 2,000 Units by mouth daily.    hydrOXYzine (ATARAX) 25 MG tablet Take 1 tablet (25 mg total) by mouth 3 (three) times daily as needed.   Multiple Vitamin (MULTIVITAMIN) capsule Take 1 capsule by mouth daily.   sertraline (ZOLOFT) 100 MG tablet Take 1 tablet (100 mg total) by mouth daily.   No current facility-administered medications on file prior to visit.    Allergies:  No Known Allergies  Social History:  Social History   Socioeconomic History  Marital status: Single    Spouse name: Not on file   Number of children: Not on file   Years of education: Not on file   Highest education level: Not on file  Occupational History   Not on file  Tobacco Use   Smoking status: Every Day    Packs/day: 0.50    Types: Cigarettes   Smokeless tobacco: Never   Tobacco comments:    counseling, habit replacement  Vaping Use   Vaping Use: Never used  Substance and Sexual Activity   Alcohol use: No    Alcohol/week: 0.0 standard drinks of alcohol   Drug use: No   Sexual activity: Not Currently    Birth control/protection: Other-see comments    Comment: TUBAL LIGATION  Other Topics Concern   Not on file  Social History Narrative   Not on file   Social Determinants of Health   Financial Resource Strain: Not on file  Food Insecurity: Not on file  Transportation Needs: Not on file  Physical Activity: Not on file  Stress: Not on file  Social Connections: Not on file  Intimate  Partner Violence: Not on file   Social History   Tobacco Use  Smoking Status Every Day   Packs/day: 0.50   Types: Cigarettes  Smokeless Tobacco Never  Tobacco Comments   counseling, habit replacement   Social History   Substance and Sexual Activity  Alcohol Use No   Alcohol/week: 0.0 standard drinks of alcohol    Family History:  Family History  Problem Relation Age of Onset   Diabetes Mother    Hypertension Mother    Diabetes Father    Hypertension Father    Depression Sister    Hypertension Sister    Allergies Son    Depression Sister    Breast cancer Neg Hx     Past medical history, surgical history, medications, allergies, family history and social history reviewed with patient today and changes made to appropriate areas of the chart.   Review of Systems  Eyes:  Negative for blurred vision and double vision.  Respiratory:  Negative for shortness of breath.   Cardiovascular:  Negative for chest pain, palpitations and leg swelling.  Neurological:  Negative for dizziness and headaches.  Psychiatric/Behavioral:  Positive for depression. Negative for suicidal ideas. The patient is nervous/anxious.    All other ROS negative except what is listed above and in the HPI.      Objective:    BP 117/80   Pulse 76   Temp 98.2 F (36.8 C) (Oral)   Wt 223 lb 6.4 oz (101.3 kg)   LMP 03/15/2015   SpO2 98%   BMI 36.08 kg/m   Wt Readings from Last 3 Encounters:  12/09/22 223 lb 6.4 oz (101.3 kg)  11/12/22 221 lb 11.2 oz (100.6 kg)  06/09/22 229 lb 9.6 oz (104.1 kg)    Physical Exam Vitals and nursing note reviewed.  Constitutional:      General: She is awake. She is not in acute distress.    Appearance: Normal appearance. She is well-developed. She is obese. She is not ill-appearing.  HENT:     Head: Normocephalic and atraumatic.     Right Ear: Hearing, tympanic membrane, ear canal and external ear normal. No drainage.     Left Ear: Hearing, tympanic membrane, ear  canal and external ear normal. No drainage.     Nose: Nose normal.     Right Sinus: No maxillary sinus tenderness or frontal sinus  tenderness.     Left Sinus: No maxillary sinus tenderness or frontal sinus tenderness.     Mouth/Throat:     Mouth: Mucous membranes are moist.     Pharynx: Oropharynx is clear. Uvula midline. No pharyngeal swelling, oropharyngeal exudate or posterior oropharyngeal erythema.  Eyes:     General: Lids are normal.        Right eye: No discharge.        Left eye: No discharge.     Extraocular Movements: Extraocular movements intact.     Conjunctiva/sclera: Conjunctivae normal.     Pupils: Pupils are equal, round, and reactive to light.     Visual Fields: Right eye visual fields normal and left eye visual fields normal.  Neck:     Thyroid: No thyromegaly.     Vascular: No carotid bruit.     Trachea: Trachea normal.  Cardiovascular:     Rate and Rhythm: Normal rate and regular rhythm.     Heart sounds: Normal heart sounds. No murmur heard.    No gallop.  Pulmonary:     Effort: Pulmonary effort is normal. No accessory muscle usage or respiratory distress.     Breath sounds: Normal breath sounds.  Chest:  Breasts:    Right: Normal.     Left: Normal.  Abdominal:     General: Bowel sounds are normal.     Palpations: Abdomen is soft. There is no hepatomegaly or splenomegaly.     Tenderness: There is no abdominal tenderness.  Musculoskeletal:        General: Normal range of motion.     Cervical back: Normal range of motion and neck supple.     Right lower leg: No edema.     Left lower leg: No edema.  Lymphadenopathy:     Head:     Right side of head: No submental, submandibular, tonsillar, preauricular or posterior auricular adenopathy.     Left side of head: No submental, submandibular, tonsillar, preauricular or posterior auricular adenopathy.     Cervical: No cervical adenopathy.     Upper Body:     Right upper body: No supraclavicular, axillary or  pectoral adenopathy.     Left upper body: No supraclavicular, axillary or pectoral adenopathy.  Skin:    General: Skin is warm and dry.     Capillary Refill: Capillary refill takes less than 2 seconds.     Findings: No rash.  Neurological:     Mental Status: She is alert and oriented to person, place, and time.     Gait: Gait is intact.     Deep Tendon Reflexes: Reflexes are normal and symmetric.     Reflex Scores:      Brachioradialis reflexes are 2+ on the right side and 2+ on the left side.      Patellar reflexes are 2+ on the right side and 2+ on the left side. Psychiatric:        Attention and Perception: Attention normal.        Mood and Affect: Mood normal.        Speech: Speech normal.        Behavior: Behavior normal. Behavior is cooperative.        Thought Content: Thought content normal.        Judgment: Judgment normal.     Results for orders placed or performed in visit on 06/09/22  Surgical pathology  Result Value Ref Range   SURGICAL PATHOLOGY      SURGICAL  PATHOLOGY CASE: [id number] PATIENT: [facility] Surgical Pathology Report     Clinical History: (ms)     FINAL MICROSCOPIC DIAGNOSIS:  A.   SKIN, RIGHT LEG, LESION, SHAVE BIOPSY: STASIS DERMATITIS  MICROSCOPIC DESCRIPTION: There is a marked degree of vascular proliferation in the dermis associated with a lymphocytic infiltrate with scattered histiocytic cells.  The epidermis is intact. Leukocytoclasis and thrombosis are not conspicuous. The PAS stain for fungus is negative. The Iron stain highlights hemosiderin within the dermis. Multiple sections are reviewed.  This histology correlates with stasis dermatitis in the proper clinical setting.  There is no appreciable atypia.  Though this can be seen in stasis, a traumatic etiology is in the differential diagnosis.    GROSS DESCRIPTION:  Received in formalin is a 0.7 x 0.4 x 0.1 cm skin shave biopsy.  The specimen is inked, sectioned  and entirely submitted in 1 cassette.  [facility] 06/10/2022)    Final Diagnosis performed by Vallarie Mare, MD.   Electronically signed 06/15/2022 Technical component performed at [employer]. [facility], [place] [address] [place], [place] [address].  Professional component performed at [facility]. [address] [place], [place], [place] [address].  Immunohistochemistry Technical component (if applicable) was performed at [facility]. [address], [place], [place], [place] [address].   IMMUNOHISTOCHEMISTRY DISCLAIMER (if applicable): Some of these immunohistochemical stains may have been developed and the performance characteristics determine by [facility]. Some may not have been cleared or approved by the U.S. Food and Drug Administration. The FDA has determined that such clearance or approval is not necessary. This test is used for clinical purposes. It should not be regarded as investigational or for research. This laboratory is certified under the [place] boratory Improvement Amendments of 1988 (CLIA-88) as qualified to perform high complexity clinical laboratory testing.  The controls stained appropriately.       Assessment & Plan:   Problem List Items Addressed This Visit       Other   Vitamin D deficiency    Labs ordered at visit today.  Will make recommendations based on lab results.         Relevant Orders   Vitamin D (25 hydroxy)   Depression    Chronic.  Controlled.  Continue with current medication regimen on Zoloft '100mg'$  daily.  Labs ordered today.  Return to clinic in 6 months for reevaluation.  Call sooner if concerns arise.        Cigarette smoker    Currently smoking 0.5 ppd.       Hyperlipidemia    Chronic.  Controlled.  Continue with current medication regimen on Atorvastatin '10mg'$ .  Labs ordered today.  Return to clinic in 6 months for reevaluation.  Call sooner if concerns arise.         Relevant Orders   Lipid panel   Obesity (BMI 35.0-39.9 without comorbidity)    Recommended eating smaller high protein, low fat meals more frequently and exercising 30 mins a day 5 times a week with a goal of 10-15lb weight loss in the next 3 months.       Other Visit Diagnoses     Annual physical exam    -  Primary   Health maintenance reviewed during visit today.  Labs ordered.  Declined pneumonia and flu.  Will get shingles at later date. Cologuard ordered. PAP up to date.   Relevant Orders   CBC with Differential/Platelet   Comprehensive metabolic panel  TSH   Urinalysis, Routine w reflex microscopic   Vitamin D (25 hydroxy)   Encounter for screening colonoscopy       Relevant Orders   Cologuard        Follow up plan: Return in about 5 months (around 05/10/2023) for HTN, HLD, DM2 FU.   LABORATORY TESTING:  - Pap smear: up to date  IMMUNIZATIONS:   - Tdap: Tetanus vaccination status reviewed: last tetanus booster within 10 years. - Influenza: Up to date - Pneumovax: Refused - Prevnar: Not applicable - COVID: Up to date - HPV: Not applicable - Shingrix vaccine: will get at next visit  SCREENING: -Mammogram: Up to date  - Colonoscopy: ordered today - Bone Density: Not applicable  -Hearing Test: Not applicable  -Spirometry: Not applicable   PATIENT COUNSELING:   Advised to take 1 mg of folate supplement per day if capable of pregnancy.   Sexuality: Discussed sexually transmitted diseases, partner selection, use of condoms, avoidance of unintended pregnancy  and contraceptive alternatives.   Advised to avoid cigarette smoking.  I discussed with the patient that most people either abstain from alcohol or drink within safe limits (<=14/week and <=4 drinks/occasion for males, <=7/weeks and <= 3 drinks/occasion for females) and that the risk for alcohol disorders and other health effects rises proportionally with the number of drinks per week and how often a  drinker exceeds daily limits.  Discussed cessation/primary prevention of drug use and availability of treatment for abuse.   Diet: Encouraged to adjust caloric intake to maintain  or achieve ideal body weight, to reduce intake of dietary saturated fat and total fat, to limit sodium intake by avoiding high sodium foods and not adding table salt, and to maintain adequate dietary potassium and calcium preferably from fresh fruits, vegetables, and low-fat dairy products.    stressed the importance of regular exercise  Injury prevention: Discussed safety belts, safety helmets, smoke detector, smoking near bedding or upholstery.   Dental health: Discussed importance of regular tooth brushing, flossing, and dental visits.    NEXT PREVENTATIVE PHYSICAL DUE IN 1 YEAR. Return in about 5 months (around 05/10/2023) for HTN, HLD, DM2 FU.

## 2022-12-09 NOTE — Assessment & Plan Note (Signed)
Chronic.  Controlled.  Continue with current medication regimen on Zoloft '100mg'$  daily.  Labs ordered today.  Return to clinic in 6 months for reevaluation.  Call sooner if concerns arise.

## 2022-12-10 LAB — COMPREHENSIVE METABOLIC PANEL
ALT: 17 IU/L (ref 0–32)
AST: 14 IU/L (ref 0–40)
Albumin/Globulin Ratio: 2.3 — ABNORMAL HIGH (ref 1.2–2.2)
Albumin: 4.2 g/dL (ref 3.8–4.9)
Alkaline Phosphatase: 96 IU/L (ref 44–121)
BUN/Creatinine Ratio: 17 (ref 9–23)
BUN: 13 mg/dL (ref 6–24)
Bilirubin Total: 0.3 mg/dL (ref 0.0–1.2)
CO2: 21 mmol/L (ref 20–29)
Calcium: 9.2 mg/dL (ref 8.7–10.2)
Chloride: 104 mmol/L (ref 96–106)
Creatinine, Ser: 0.78 mg/dL (ref 0.57–1.00)
Globulin, Total: 1.8 g/dL (ref 1.5–4.5)
Glucose: 89 mg/dL (ref 70–99)
Potassium: 4.2 mmol/L (ref 3.5–5.2)
Sodium: 140 mmol/L (ref 134–144)
Total Protein: 6 g/dL (ref 6.0–8.5)
eGFR: 90 mL/min/{1.73_m2} (ref >59)

## 2022-12-10 LAB — CBC WITH DIFFERENTIAL/PLATELET
Basophils Absolute: 0 10*3/uL (ref 0.0–0.2)
Basos: 0 %
EOS (ABSOLUTE): 0.3 10*3/uL (ref 0.0–0.4)
Eos: 3 %
Hematocrit: 42.1 % (ref 34.0–46.6)
Hemoglobin: 13.8 g/dL (ref 11.1–15.9)
Immature Grans (Abs): 0 10*3/uL (ref 0.0–0.1)
Immature Granulocytes: 0 %
Lymphocytes Absolute: 3.6 10*3/uL — ABNORMAL HIGH (ref 0.7–3.1)
Lymphs: 34 %
MCH: 29.2 pg (ref 26.6–33.0)
MCHC: 32.8 g/dL (ref 31.5–35.7)
MCV: 89 fL (ref 79–97)
Monocytes Absolute: 0.7 10*3/uL (ref 0.1–0.9)
Monocytes: 6 %
Neutrophils Absolute: 5.9 10*3/uL (ref 1.4–7.0)
Neutrophils: 57 %
Platelets: 321 10*3/uL (ref 150–450)
RBC: 4.72 x10E6/uL (ref 3.77–5.28)
RDW: 13.2 % (ref 11.7–15.4)
WBC: 10.6 10*3/uL (ref 3.4–10.8)

## 2022-12-10 LAB — LIPID PANEL
Chol/HDL Ratio: 3.9 ratio (ref 0.0–4.4)
Cholesterol, Total: 142 mg/dL (ref 100–199)
HDL: 36 mg/dL — ABNORMAL LOW (ref >39)
LDL Chol Calc (NIH): 81 mg/dL (ref 0–99)
Triglycerides: 142 mg/dL (ref 0–149)
VLDL Cholesterol Cal: 25 mg/dL (ref 5–40)

## 2022-12-10 LAB — TSH: TSH: 1.96 u[IU]/mL (ref 0.450–4.500)

## 2022-12-10 LAB — VITAMIN D 25 HYDROXY (VIT D DEFICIENCY, FRACTURES): Vit D, 25-Hydroxy: 35.4 ng/mL (ref 30.0–100.0)

## 2022-12-10 NOTE — Progress Notes (Signed)
Hi Bianca Clay.  It was nice to see you yesterday.  Your lab work looks good.  No concerns at this time. Continue with your current medication regimen.  Follow up as discussed.  Please let me know if you have any questions.

## 2022-12-18 DIAGNOSIS — Z1211 Encounter for screening for malignant neoplasm of colon: Secondary | ICD-10-CM | POA: Diagnosis not present

## 2022-12-24 ENCOUNTER — Other Ambulatory Visit: Payer: Self-pay | Admitting: Nurse Practitioner

## 2022-12-24 NOTE — Telephone Encounter (Signed)
Requested Prescriptions  Pending Prescriptions Disp Refills   sertraline (ZOLOFT) 100 MG tablet [Pharmacy Med Name: SERTRALINE HCL 100 MG TABLET] 90 tablet 1    Sig: TAKE 1 TABLET BY MOUTH EVERY DAY     Psychiatry:  Antidepressants - SSRI - sertraline Passed - 12/24/2022 12:54 PM      Passed - AST in normal range and within 360 days    AST  Date Value Ref Range Status  12/09/2022 14 0 - 40 IU/L Final         Passed - ALT in normal range and within 360 days    ALT  Date Value Ref Range Status  12/09/2022 17 0 - 32 IU/L Final         Passed - Completed PHQ-2 or PHQ-9 in the last 360 days      Passed - Valid encounter within last 6 months    Recent Outpatient Visits           2 weeks ago Annual physical exam   [place], Karen, NP   1 month ago Rash in adult   [place], PA-C   2 months ago Rash in adult   [place], PA-C   6 months ago Skin lesion of right leg   [place] Kathrine Haddock, NP   1 year ago Annual physical exam   [place] Jon Billings, NP       Future Appointments             In 4 months Jon Billings, NP [place], [facility]             atorvastatin (LIPITOR) 10 MG tablet [Pharmacy Med Name: ATORVASTATIN 10 MG TABLET] 90 tablet 3    Sig: TAKE 1 TABLET BY MOUTH EVERY DAY     Cardiovascular:  Antilipid - Statins Failed - 12/24/2022 12:54 PM      Failed - Lipid Panel in normal range within the last 12 months    Cholesterol, Total  Date Value Ref Range Status  12/09/2022 142 100 - 199 mg/dL Final   LDL Chol Calc (NIH)  Date Value Ref Range Status  12/09/2022 81 0 - 99 mg/dL Final   HDL  Date Value Ref Range Status  12/09/2022 36 (L) >39 mg/dL Final   Triglycerides  Date Value Ref Range Status  12/09/2022 142 0 - 149 mg/dL Final          Passed - Patient is not pregnant      Passed - Valid encounter within last 12 months    Recent Outpatient Visits           2 weeks ago Annual physical exam   [place], NP   1 month ago Rash in adult   [place], PA-C   2 months ago Rash in adult   [place], PA-C   6 months ago Skin lesion of right leg   [place] Kathrine Haddock, NP   1 year ago Annual physical exam   [place] Jon Billings, NP       Future Appointments             In 4 months Jon Billings, NP [facility], [facility]

## 2022-12-26 LAB — COLOGUARD: COLOGUARD: POSITIVE — AB

## 2022-12-28 ENCOUNTER — Telehealth: Payer: Self-pay | Admitting: Nurse Practitioner

## 2022-12-28 NOTE — Progress Notes (Signed)
Hi Bianca Clay. I'm sorry, it was a positive result which is why I recommend you see GI.

## 2022-12-28 NOTE — Progress Notes (Signed)
Hi Bianca Clay.  Your cologuard was negative.  I recommend you see GI for further evaluation via colonoscopy.  If you agree, I will place the referral.

## 2022-12-28 NOTE — Telephone Encounter (Signed)
Copied from [place] [id number]. Topic: Referral - Request for Referral >> Dec 28, 2022  2:17 PM Rosanne Ashing P wrote: Pt called saying since her cologuard came back positive she wants to be referred to a GI doctor.  CB#  [phone]

## 2022-12-31 ENCOUNTER — Encounter: Payer: Self-pay | Admitting: Nurse Practitioner

## 2022-12-31 DIAGNOSIS — R195 Other fecal abnormalities: Secondary | ICD-10-CM

## 2023-01-01 ENCOUNTER — Other Ambulatory Visit: Payer: Self-pay

## 2023-01-01 ENCOUNTER — Telehealth: Payer: Self-pay

## 2023-01-01 DIAGNOSIS — Z1211 Encounter for screening for malignant neoplasm of colon: Secondary | ICD-10-CM

## 2023-01-01 DIAGNOSIS — R195 Other fecal abnormalities: Secondary | ICD-10-CM

## 2023-01-01 MED ORDER — NA SULFATE-K SULFATE-MG SULF 17.5-3.13-1.6 GM/177ML PO SOLN: 1.0000 | Freq: Once | ORAL | 0 refills | Status: AC

## 2023-01-01 NOTE — Telephone Encounter (Signed)
Gastroenterology Pre-Procedure Review  Request Date: 01/15/23 Requesting Physician: Dr. Allen Norris  PATIENT REVIEW QUESTIONS: The patient responded to the following health history questions as indicated:    1. Are you having any GI issues? no 2. Do you have a personal history of Polyps? no 3. Do you have a family history of Colon Cancer or Polyps? no 4. Diabetes Mellitus? no 5. Joint replacements in the past 12 months?no 6. Major health problems in the past 3 months?no 7. Any artificial heart valves, MVP, or defibrillator?no    MEDICATIONS & ALLERGIES:    Patient reports the following regarding taking any anticoagulation/antiplatelet therapy:   Plavix, Coumadin, Eliquis, Xarelto, Lovenox, Pradaxa, Brilinta, or Effient? no Aspirin? no  Patient confirms/reports the following medications:  Current Outpatient Medications  Medication Sig Dispense Refill   albuterol (VENTOLIN HFA) 108 (90 Base) MCG/ACT inhaler Inhale 2 puffs into the lungs every 6 (six) hours as needed for wheezing or shortness of breath. 8 g 0   atorvastatin (LIPITOR) 10 MG tablet TAKE 1 TABLET BY MOUTH EVERY DAY 90 tablet 3   betamethasone dipropionate (DIPROLENE) 0.05 % ointment Please specify directions, refills and quantity 60 g 1   Cholecalciferol (VITAMIN D PO) Take 2,000 Units by mouth daily.      hydrOXYzine (ATARAX) 25 MG tablet Take 1 tablet (25 mg total) by mouth 3 (three) times daily as needed. 270 tablet 1   Multiple Vitamin (MULTIVITAMIN) capsule Take 1 capsule by mouth daily.     sertraline (ZOLOFT) 100 MG tablet TAKE 1 TABLET BY MOUTH EVERY DAY 90 tablet 1   No current facility-administered medications for this visit.    Patient confirms/reports the following allergies:  No Known Allergies  No orders of the defined types were placed in this encounter.   AUTHORIZATION INFORMATION Primary Insurance: 1D#: Group #:  Secondary Insurance: 1D#: Group #:  SCHEDULE INFORMATION: Date:  01/15/23 Time: Location: [place]

## 2023-01-01 NOTE — Telephone Encounter (Signed)
Pt returning your call to schedule a colonoscopy.

## 2023-01-08 ENCOUNTER — Encounter: Payer: Self-pay | Admitting: Gastroenterology

## 2023-01-15 ENCOUNTER — Ambulatory Visit
Admission: RE | Admit: 2023-01-15 | Discharge: 2023-01-15 | Disposition: A | Discharge status: Home / Self Care | Payer: 59 | Attending: Gastroenterology | Admitting: Gastroenterology

## 2023-01-15 ENCOUNTER — Encounter
Admission: RE | Disposition: A | Discharge status: Home / Self Care | Payer: Self-pay | Source: Home / Self Care | Attending: Gastroenterology

## 2023-01-15 ENCOUNTER — Ambulatory Visit: Payer: 59 | Admitting: Anesthesiology

## 2023-01-15 ENCOUNTER — Other Ambulatory Visit: Payer: Self-pay

## 2023-01-15 DIAGNOSIS — F419 Anxiety disorder, unspecified: Secondary | ICD-10-CM | POA: Diagnosis not present

## 2023-01-15 DIAGNOSIS — R69 Illness, unspecified: Secondary | ICD-10-CM | POA: Diagnosis not present

## 2023-01-15 DIAGNOSIS — J449 Chronic obstructive pulmonary disease, unspecified: Secondary | ICD-10-CM | POA: Insufficient documentation

## 2023-01-15 DIAGNOSIS — F32A Depression, unspecified: Secondary | ICD-10-CM | POA: Insufficient documentation

## 2023-01-15 DIAGNOSIS — K635 Polyp of colon: Secondary | ICD-10-CM

## 2023-01-15 DIAGNOSIS — K5730 Diverticulosis of large intestine without perforation or abscess without bleeding: Secondary | ICD-10-CM | POA: Insufficient documentation

## 2023-01-15 DIAGNOSIS — F17210 Nicotine dependence, cigarettes, uncomplicated: Secondary | ICD-10-CM | POA: Diagnosis not present

## 2023-01-15 DIAGNOSIS — F418 Other specified anxiety disorders: Secondary | ICD-10-CM | POA: Diagnosis not present

## 2023-01-15 DIAGNOSIS — R195 Other fecal abnormalities: Secondary | ICD-10-CM | POA: Diagnosis not present

## 2023-01-15 DIAGNOSIS — Z1211 Encounter for screening for malignant neoplasm of colon: Secondary | ICD-10-CM | POA: Diagnosis not present

## 2023-01-15 DIAGNOSIS — D122 Benign neoplasm of ascending colon: Secondary | ICD-10-CM | POA: Diagnosis not present

## 2023-01-15 DIAGNOSIS — F17200 Nicotine dependence, unspecified, uncomplicated: Secondary | ICD-10-CM | POA: Diagnosis not present

## 2023-01-15 HISTORY — PX: COLONOSCOPY WITH PROPOFOL: SHX5780

## 2023-01-15 HISTORY — PX: POLYPECTOMY: SHX5525

## 2023-01-15 SURGERY — COLONOSCOPY WITH PROPOFOL
Anesthesia: General | Site: Rectum

## 2023-01-15 MED ORDER — STERILE WATER FOR IRRIGATION IR SOLN
Status: DC | PRN
  Administered 2023-01-15: 200 mL

## 2023-01-15 MED ORDER — PROPOFOL 10 MG/ML IV BOLUS
INTRAVENOUS | Status: DC | PRN
  Administered 2023-01-15: 50 mg via INTRAVENOUS
  Administered 2023-01-15 (×2): 20 mg via INTRAVENOUS
  Administered 2023-01-15: 40 mg via INTRAVENOUS
  Administered 2023-01-15 (×2): 20 mg via INTRAVENOUS
  Administered 2023-01-15 (×4): 30 mg via INTRAVENOUS
  Administered 2023-01-15: 50 mg via INTRAVENOUS
  Administered 2023-01-15: 30 mg via INTRAVENOUS
  Administered 2023-01-15: 80 mg via INTRAVENOUS

## 2023-01-15 MED ORDER — LACTATED RINGERS IV SOLN: INTRAVENOUS | Status: DC

## 2023-01-15 MED ORDER — SODIUM CHLORIDE 0.9 % IV SOLN: INTRAVENOUS | Status: DC

## 2023-01-15 SURGICAL SUPPLY — 23 items
CLIP HMST 235XBRD CATH ROT (MISCELLANEOUS) IMPLANT
CLIP RESOLUTION 360 11X235 (MISCELLANEOUS) ×10
ELECT REM PT RETURN 9FT ADLT (ELECTROSURGICAL)
ELECTRODE REM PT RTRN 9FT ADLT (ELECTROSURGICAL) IMPLANT
FORCEPS BIOP RAD 4 LRG CAP 4 (CUTTING FORCEPS) IMPLANT
GOWN CVR UNV OPN BCK APRN NK (MISCELLANEOUS) ×4 IMPLANT
GOWN ISOL THUMB LOOP REG UNIV (MISCELLANEOUS) ×4
INJECTABLE ELEVIEW COMP 10 (MISCELLANEOUS) IMPLANT
INJECTOR VARIJECT VIN23 (MISCELLANEOUS) IMPLANT
KIT DEFENDO VALVE AND CONN (KITS) IMPLANT
KIT PRC NS LF DISP ENDO (KITS) ×2 IMPLANT
KIT PROCEDURE OLYMPUS (KITS) ×2
MANIFOLD NEPTUNE II (INSTRUMENTS) ×2 IMPLANT
MARKER SPOT ENDO TATTOO 5ML (MISCELLANEOUS) IMPLANT
PROBE APC STR FIRE (PROBE) IMPLANT
RETRIEVER NET ROTH 2.5X230 LF (MISCELLANEOUS) IMPLANT
SNARE COLD EXACTO (MISCELLANEOUS) IMPLANT
SNARE LASSO HEX 3 IN 1 (INSTRUMENTS) IMPLANT
SNARE SHORT THROW 13M SML OVAL (MISCELLANEOUS) IMPLANT
SNARE SNG USE RND 15MM (INSTRUMENTS) IMPLANT
TRAP ETRAP POLY (MISCELLANEOUS) IMPLANT
VARIJECT INJECTOR VIN23 (MISCELLANEOUS) ×2
WATER STERILE IRR 250ML POUR (IV SOLUTION) ×2 IMPLANT

## 2023-01-15 NOTE — H&P (Signed)
Bianca Lame, MD [facility] [address], [name], [place] [address] Phone:[phone] Fax : [phone]  Primary Care Physician:  Jon Billings, NP Primary Gastroenterologist:  Dr. Allen Norris  Pre-Procedure History & Physical: HPI:  Bianca Clay is a 57 y.o. female is here for an colonoscopy.   Past Medical History:  Diagnosis Date   Anxiety    Cervical dysplasia    Depression    Galactorrhea not associated with childbirth 2011   DR. BALL   History of mammogram 09/19/2013   BIRAD I   History of Papanicolaou smear of cervix 03/29/2012   -/-    Past Surgical History:  Procedure Laterality Date   CERVICAL CONE BIOPSY  1989   CESAREAN SECTION     X1   HYSTEROSCOPY     [place]      Prior to Admission medications   Medication Sig Start Date End Date Taking? Authorizing Provider  albuterol (VENTOLIN HFA) 108 (90 Base) MCG/ACT inhaler Inhale 2 puffs into the lungs every 6 (six) hours as needed for wheezing or shortness of breath. 11/27/22  Yes Johnson, Megan P, DO  atorvastatin (LIPITOR) 10 MG tablet TAKE 1 TABLET BY MOUTH EVERY DAY 12/24/22  Yes Jon Billings, NP  betamethasone dipropionate (DIPROLENE) 0.05 % ointment Please specify directions, refills and quantity 06/09/22  Yes Kathrine Haddock, NP  Cholecalciferol (VITAMIN D PO) Take 2,000 Units by mouth daily.    Yes [provider]  hydrOXYzine (ATARAX) 25 MG tablet Take 1 tablet (25 mg total) by mouth 3 (three) times daily as needed. 12/08/21  Yes Jon Billings, NP  Multiple Vitamin (MULTIVITAMIN) capsule Take 1 capsule by mouth daily.   Yes [provider]  sertraline (ZOLOFT) 100 MG tablet TAKE 1 TABLET BY MOUTH EVERY DAY 12/24/22  Yes Jon Billings, NP    Allergies as of 01/01/2023   (No Known Allergies)    Family History  Problem Relation Age of Onset   Diabetes Mother    Hypertension Mother    Diabetes Father    Hypertension Father    Depression  Sister    Hypertension Sister    Allergies Son    Depression Sister    Breast cancer Neg Hx     Social History   Socioeconomic History   Marital status: Single    Spouse name: Not on file   Number of children: Not on file   Years of education: Not on file   Highest education level: Not on file  Occupational History   Not on file  Tobacco Use   Smoking status: Every Day    Packs/day: 0.50    Types: Cigarettes   Smokeless tobacco: Never   Tobacco comments:    counseling, habit replacement  Vaping Use   Vaping Use: Never used  Substance and Sexual Activity   Alcohol use: No    Alcohol/week: 0.0 standard drinks of alcohol   Drug use: No   Sexual activity: Not Currently    Birth control/protection: Other-see comments    Comment: TUBAL LIGATION  Other Topics Concern   Not on file  Social History Narrative   Not on file   Social Determinants of Health   Financial Resource Strain: Not on file  Food Insecurity: Not on file  Transportation Needs: Not on file  Physical Activity: Not on file  Stress: Not on file  Social Connections: Not on file  Intimate Partner Violence: Not on file    Review  of Systems: See HPI, otherwise negative ROS  Physical Exam: BP 120/79   Pulse 71   Temp (!) 97.1 F (36.2 C) (Temporal)   Resp 16   Ht 5' 7"$  (1.702 m)   Wt 98.4 kg   LMP 03/15/2015   SpO2 97%   BMI 33.99 kg/m  General:   Alert,  pleasant and cooperative in NAD Head:  Normocephalic and atraumatic. Neck:  Supple; no masses or thyromegaly. Lungs:  Clear throughout to auscultation.    Heart:  Regular rate and rhythm. Abdomen:  Soft, nontender and nondistended. Normal bowel sounds, without guarding, and without rebound.   Neurologic:  Alert and  oriented x4;  grossly normal neurologically.  Impression/Plan: Bianca Clay is here for an colonoscopy to be performed for positive cologard  Risks, benefits, limitations, and alternatives regarding  colonoscopy have been  reviewed with the patient.  Questions have been answered.  All parties agreeable.   Bianca Lame, MD  01/15/2023, 8:21 AM

## 2023-01-15 NOTE — Op Note (Signed)
[facility] ([place]) Gastroenterology Patient Name: Bianca Clay Procedure Date: 01/15/2023 8:57 AM MRN: [id number] Account #: [id number] Date of Birth: May 16, 1966 Admit Type: Outpatient Age: 57 Room: [facility] OR ROOM 01 Gender: Female Note Status: Finalized Instrument Name: [id number] Procedure:             Colonoscopy Indications:           Positive Cologuard test Providers:             Lucilla Lame MD, MD Referring MD:          Jon Billings (Referring MD) Medicines:             Propofol per Anesthesia Complications:         No immediate complications. Procedure:             Pre-Anesthesia Assessment:                        - Prior to the procedure, a History and Physical was                         performed, and patient medications and allergies were                         reviewed. The patient's tolerance of previous                         anesthesia was also reviewed. The risks and benefits                         of the procedure and the sedation options and risks                         were discussed with the patient. All questions were                         answered, and informed consent was obtained. Prior                         Anticoagulants: The patient has taken no anticoagulant                         or antiplatelet agents. ASA Grade Assessment: II - A                         patient with mild systemic disease. After reviewing                         the risks and benefits, the patient was deemed in                         satisfactory condition to undergo the procedure.                        After obtaining informed consent, the colonoscope was                         passed under direct vision. Throughout the procedure,  the patient's blood pressure, pulse, and oxygen                         saturations were monitored continuously. The                         Colonoscope was introduced through the anus and                          advanced to the the cecum, identified by appendiceal                         orifice and ileocecal valve. The colonoscopy was                         performed without difficulty. The patient tolerated                         the procedure well. The quality of the bowel                         preparation was good. Findings:      The perianal and digital rectal examinations were normal.      A 25 mm polyp was found in the ascending colon. The polyp was sessile.       Preparations were made for mucosal resection. Chromoscopy with indigo       carmine was done. Demarcation of the lesion was performed during the       procedure to clearly identify the boundaries of the lesion. Saline with       indigo carmine was injected to raise the lesion. Snare mucosal resection       was performed. Resection and retrieval were complete. Resected tissue       including tissue margins will be examined by histology. To close a       defect after polypectomy, five hemostatic clips were successfully placed       (MR conditional). There was no bleeding at the end of the procedure.       Biopsies were taken with a cold forceps for histology.      A few small-mouthed diverticula were found in the entire colon. Impression:            - One 25 mm polyp in the ascending colon, removed with                         mucosal resection. Resected and retrieved. Clips (MR                         conditional) were placed. Biopsied.                        - Diverticulosis in the entire examined colon.                        - Mucosal resection was performed. Resection and                         retrieval were complete. Recommendation:        - Discharge patient to home.                        -  Resume previous diet.                        - Continue present medications.                        - Await pathology results. Procedure Code(s):     --- Professional ---                        [phone], Colonoscopy, flexible;  with endoscopic mucosal                         resection Diagnosis Code(s):     --- Professional ---                        R19.5, Other fecal abnormalities                        D12.2, Benign neoplasm of ascending colon CPT copyright 2022 American Medical Association. All rights reserved. The codes documented in this report are preliminary and upon coder review may  be revised to meet current compliance requirements. Lucilla Lame MD, MD 01/15/2023 10:07:30 AM This report has been signed electronically. Number of Addenda: 0 Note Initiated On: 01/15/2023 8:57 AM Scope Withdrawal Time: 0 hours 49 minutes 30 seconds  Total Procedure Duration: 0 hours 53 minutes 15 seconds  Estimated Blood Loss:  Estimated blood loss: none.      [facility]

## 2023-01-15 NOTE — Anesthesia Preprocedure Evaluation (Signed)
Anesthesia Evaluation  Patient identified by MRN, date of birth, ID band Patient awake    Reviewed: Allergy & Precautions, NPO status , Patient's Chart, lab work & pertinent test results  History of Anesthesia Complications Negative for: history of anesthetic complications  Airway Mallampati: III  TM Distance: >3 FB Neck ROM: full    Dental  (+) Dental Advidsory Given   Pulmonary COPD,  COPD inhaler, Current Smoker and Patient abstained from smoking.   Pulmonary exam normal        Cardiovascular (-) Past MI and (-) CABG negative cardio ROS Normal cardiovascular exam     Neuro/Psych  PSYCHIATRIC DISORDERS Anxiety Depression    negative neurological ROS     GI/Hepatic negative GI ROS, Neg liver ROS,,,  Endo/Other  negative endocrine ROS    Renal/GU negative Renal ROS  negative genitourinary   Musculoskeletal   Abdominal   Peds  Hematology negative hematology ROS (+)   Anesthesia Other Findings Past Medical History: No date: Anxiety No date: Cervical dysplasia No date: Depression 2011: Galactorrhea not associated with childbirth     Comment:  DR. BALL 09/19/2013: History of mammogram     Comment:  BIRAD I 03/29/2012: History of Papanicolaou smear of cervix     Comment:  -/-  Past Surgical History: 1989: CERVICAL CONE BIOPSY No date: CESAREAN SECTION     Comment:  X1 No date: HYSTEROSCOPY 1980: TONSILLECTOMY No date: TUBAL LIGATION  BMI    Body Mass Index: 33.99 kg/m      Reproductive/Obstetrics negative OB ROS                             Anesthesia Physical Anesthesia Plan  ASA: 3  Anesthesia Plan: General   Post-op Pain Management: Minimal or no pain anticipated   Induction: Intravenous  PONV Risk Score and Plan: 3 and Propofol infusion, TIVA and Ondansetron  Airway Management Planned: Nasal Cannula  Additional Equipment: None  Intra-op Plan:    Post-operative Plan:   Informed Consent: I have reviewed the patients History and Physical, chart, labs and discussed the procedure including the risks, benefits and alternatives for the proposed anesthesia with the patient or authorized representative who has indicated his/her understanding and acceptance.     Dental advisory given  Plan Discussed with: CRNA and Surgeon  Anesthesia Plan Comments: (Discussed risks of anesthesia with patient, including possibility of difficulty with spontaneous ventilation under anesthesia necessitating airway intervention, PONV, and rare risks such as cardiac or respiratory or neurological events, and allergic reactions. Discussed the role of CRNA in patient's perioperative care. Patient understands.)       Anesthesia Quick Evaluation

## 2023-01-15 NOTE — Transfer of Care (Signed)
Immediate Anesthesia Transfer of Care Note  Patient: Bianca Clay  Procedure(s) Performed: COLONOSCOPY WITH PROPOFOL (Rectum) POLYPECTOMY (Rectum)  Patient Location: PACU  Anesthesia Type: General  Level of Consciousness: awake, alert  and patient cooperative  Airway and Oxygen Therapy: Patient Spontanous Breathing and Patient connected to supplemental oxygen  Post-op Assessment: Post-op Vital signs reviewed, Patient's Cardiovascular Status Stable, Respiratory Function Stable, Patent Airway and No signs of Nausea or vomiting  Post-op Vital Signs: Reviewed and stable  Complications: No notable events documented.

## 2023-01-15 NOTE — Anesthesia Postprocedure Evaluation (Signed)
Anesthesia Post Note  Patient: Bianca Clay  Procedure(s) Performed: COLONOSCOPY WITH PROPOFOL (Rectum) POLYPECTOMY (Rectum)  Patient location during evaluation: PACU Anesthesia Type: General Level of consciousness: awake and alert Pain management: pain level controlled Vital Signs Assessment: post-procedure vital signs reviewed and stable Respiratory status: spontaneous breathing, nonlabored ventilation, respiratory function stable and patient connected to nasal cannula oxygen Cardiovascular status: blood pressure returned to baseline and stable Postop Assessment: no apparent nausea or vomiting Anesthetic complications: no  No notable events documented.   Last Vitals:  Vitals:   01/15/23 0753 01/15/23 1008  BP: 120/79 108/71  Pulse: 71 66  Resp: 16 14  Temp: (!) 36.2 C (!) 36.1 C  SpO2: 97% 98%    Last Pain:  Vitals:   01/15/23 1008  TempSrc:   PainSc: 0-No pain                 Dimas Millin

## 2023-01-18 ENCOUNTER — Encounter: Payer: Self-pay | Admitting: Gastroenterology

## 2023-01-19 LAB — SURGICAL PATHOLOGY

## 2023-01-25 ENCOUNTER — Telehealth: Payer: Self-pay

## 2023-01-25 NOTE — Telephone Encounter (Signed)
Please make sure this patient has an office follow-up to discuss her pathology results and plan for her next procedure.

## 2023-01-28 NOTE — Telephone Encounter (Signed)
Left message on voicemail Appt scheduled, appt reminder mailed

## 2023-03-01 ENCOUNTER — Ambulatory Visit: Payer: Self-pay | Admitting: *Deleted

## 2023-03-01 NOTE — Telephone Encounter (Signed)
Please call and change visit to virtual if possible since patient is covid positive.

## 2023-03-01 NOTE — Telephone Encounter (Signed)
Summary: Positive Covid Test   Pt tested positive for Covid and doesn't have any symptoms other than a cough. Pt would like to speak with a nurse regarding her next steps.         Chief Complaint: positive covid at home test  Symptoms: cough , productive, thick mucus. Has been taking mucinex. 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 Reports difficulty breathing when wearing N 95 mask at work. O2 sat 96% RA.  Frequency: Friday 02/26/23 Pertinent Negatives: Patient denies chest pain no difficulty breathing no fever Disposition: [] ED /[] Urgent Care (no appt availability in office) / [x] Appointment(In office/virtual)/ []  [facility] [place] [facility]/ [] Home Care/ [] Refused Recommended Disposition /[]  Mobile Bus/ []  Follow-up with PCP Additional Notes:   Appt scheduled for 03/02/23. Please advise if appt needs to be changed to VV.       Reason for Disposition  [1] HIGH RISK patient (e.g., weak immune system, age > 87 years, obesity with BMI 30 or higher, pregnant, chronic lung disease or other chronic medical condition) AND [2] COVID symptoms (e.g., cough, fever)  (Exceptions: Already seen by PCP and no new or worsening symptoms.)  Answer Assessment - Initial Assessment Questions 1. COVID-19 DIAGNOSIS: "How do you know that you have COVID?" (e.g., positive lab test or self-test, diagnosed by doctor or NP/PA, symptoms after exposure).     At home postive covid test today  2. COVID-19 EXPOSURE: "Was there any known exposure to COVID before the symptoms began?" CDC Definition of close contact: within 6 feet (2 meters) for a total of 15 minutes or more over a 24-hour period.      Yes exposed to patients at her work / facility  3. ONSET: "When did the COVID-19 symptoms start?"      Friday started mucinex 4. WORST SYMPTOM: "What is your worst symptom?" (e.g., cough, fever, shortness of breath, muscle aches)     Cough  5. COUGH: "Do you have a cough?" If Yes, ask: "How bad is the  cough?"       Yes productive thick mucus  6. FEVER: "Do you have a fever?" If Yes, ask: "What is your temperature, how was it measured, and when did it start?"     na 7. RESPIRATORY STATUS: "Describe your breathing?" (e.g., normal; shortness of breath, wheezing, unable to speak)      Breathing ok now. Reports using sons nebulizer at night  and helps. 8. BETTER-SAME-WORSE: "Are you getting better, staying the same or getting worse compared to yesterday?"  If getting worse, ask, "In what way?"     Na  9. OTHER SYMPTOMS: "Do you have any other symptoms?"  (e.g., chills, fatigue, headache, loss of smell or taste, muscle pain, sore throat)     Cough productive thick . Taking mucinex.  10. HIGH RISK DISEASE: "Do you have any chronic medical problems?" (e.g., asthma, heart or lung disease, weak immune system, obesity, etc.)       No  11. VACCINE: "Have you had the COVID-19 vaccine?" If Yes, ask: "Which one, how many shots, when did you get it?"       Yes x 3  12. PREGNANCY: "Is there any chance you are pregnant?" "When was your last menstrual period?"       na 13. O2 SATURATION MONITOR:  "Do you use an oxygen saturation monitor (pulse oximeter) at home?" If Yes, ask "What is your reading (oxygen level) today?" "What is your usual oxygen saturation reading?" (e.g., 95%)  95%  Protocols used: Coronavirus ([id number]) Diagnosed or Suspected-A-AH

## 2023-03-02 ENCOUNTER — Encounter: Payer: Self-pay | Admitting: Nurse Practitioner

## 2023-03-02 ENCOUNTER — Telehealth (INDEPENDENT_AMBULATORY_CARE_PROVIDER_SITE_OTHER): Payer: 59 | Admitting: Nurse Practitioner

## 2023-03-02 VITALS — BP 118/78 | HR 78 | Temp 97.5°F

## 2023-03-02 DIAGNOSIS — U071 COVID-19: Secondary | ICD-10-CM

## 2023-03-02 NOTE — Progress Notes (Signed)
BP 118/78   Pulse 78   Temp (!) 97.5 F (36.4 C) (Temporal)   LMP 03/15/2015   SpO2 95%    Subjective:    Patient ID: Bianca Clay, female    DOB: Aug 31, 1966, 57 y.o.   MRN: [id number]  HPI: Bianca Clay is a 57 y.o. female  Chief Complaint  Patient presents with   Covid Positive    Pt states she tested positive for covid yesterday. States she started having a cough this past Friday. States the only symptoms she has had is the cough and post nasal drip. States she has been taking Mucinex fast max for symptom relief.    UPPER RESPIRATORY TRACT INFECTION Worst symptom: COVID Positive- yesterday home test Fever: no Cough: yes Shortness of breath: yes Wheezing: yes Chest pain: no Chest tightness: no Chest congestion: yes Nasal congestion: yes Runny nose: yes Post nasal drip: yes Sneezing: no Sore throat: no Swollen glands: no Sinus pressure: no Headache: no Face pain: no Toothache: no Ear pain: no bilateral Ear pressure: no bilateral Eyes red/itching:no Eye drainage/crusting: no  Vomiting: no Rash: no Fatigue: no Sick contacts: no Strep contacts: no  Context: stable Recurrent sinusitis: no Relief with OTC cold/cough medications: yes  Treatments attempted: mucinex    Relevant past medical, surgical, family and social history reviewed and updated as indicated. Interim medical history since our last visit reviewed. Allergies and medications reviewed and updated.  Review of Systems  Constitutional:  Positive for fatigue. Negative for fever.  HENT:  Positive for congestion, postnasal drip and rhinorrhea. Negative for dental problem, ear pain, sinus pressure, sinus pain, sneezing and sore throat.   Respiratory:  Positive for cough, shortness of breath and wheezing.   Cardiovascular:  Negative for chest pain.  Gastrointestinal:  Negative for vomiting.  Skin:  Negative for rash.  Neurological:  Negative for headaches.    Per HPI unless specifically  indicated above     Objective:    BP 118/78   Pulse 78   Temp (!) 97.5 F (36.4 C) (Temporal)   LMP 03/15/2015   SpO2 95%   Wt Readings from Last 3 Encounters:  01/15/23 217 lb (98.4 kg)  12/09/22 223 lb 6.4 oz (101.3 kg)  11/12/22 221 lb 11.2 oz (100.6 kg)    Physical Exam Vitals and nursing note reviewed.  Constitutional:      General: She is not in acute distress.    Appearance: She is not ill-appearing.  HENT:     Head: Normocephalic.     Right Ear: Hearing normal.     Left Ear: Hearing normal.     Nose: Nose normal.  Pulmonary:     Effort: Pulmonary effort is normal. No respiratory distress.  Neurological:     Mental Status: She is alert.  Psychiatric:        Mood and Affect: Mood normal.        Behavior: Behavior normal.        Thought Content: Thought content normal.        Judgment: Judgment normal.     Results for orders placed or performed during the hospital encounter of 01/15/23  Surgical pathology  Result Value Ref Range   SURGICAL PATHOLOGY      SURGICAL PATHOLOGY CASE: [id number] PATIENT: [facility] Surgical Pathology Report     Specimen Submitted: A. Colon polyp, ascending; hot snare B. Colon polyp, ascending center; cbx  Clinical History: Screening colonoscopy, positive cologuard  DIAGNOSIS: A. COLON POLYP, ASCENDING; SNARE BIOPSY: - MULTIPLE FRAGMENTS OF TUBULOVILLOUS ADENOMA. - NEGATIVE FOR HIGH-GRADE DYSPLASIA AND MALIGNANCY.  B. COLON POLYP, ASCENDING, CENTER; BIOPSY: - FEW FRAGMENTS OF BENIGN COLONIC MUCOSA AND UNDERLYING FIBROMUSCULAR STROMA. - NEGATIVE FOR ATYPIA AND MALIGNANCY.   GROSS DESCRIPTION: A. Labeled: Ascending colon polyp-hot snare Received: Formalin Collection time: 8:59 AM on 01/15/2023 Placed into formalin time: 8:59 AM on 01/15/2023 Tissue fragment(s): Multiple Size: Aggregate, 2.5 x 2.0 x 1.5 cm Description: Received are tan soft tissue fragments.  The largest fragment is  trisected Entirely submitted in 3 cassettes with smaller fragments in A1 and A2, a nd largest fragment in A3.  B. Labeled: Ascending colon polyp center-cold biopsy Received: Formalin Collection time: 9:41 AM on 01/15/2023 Placed into formalin time: 9:41 AM on 01/15/2023 Tissue fragment(s): Multiple Size: Aggregate, 0.8 x 0.2 x 0.1 cm Description: Received are tan to brown soft tissue fragments Entirely submitted in 1 cassette.  CM 01/18/2023  Final Diagnosis performed by Raynelle Bring, MD.   Electronically signed 01/19/2023 2:35:42PM The electronic signature indicates that the named Attending Pathologist has evaluated the specimen Technical component performed at [facility], [address] [place], [place] [address] Lab: [phone] Dir: Rush Farmer, MD, MMM  Professional component performed at [facility], [facility], [place], [place], [place] [address] Lab: [phone] Dir: Kathi Simpers, MD       Assessment & Plan:   Problem List Items Addressed This Visit   None Visit Diagnoses     COVID-19    -  Primary   Continue with over the counter management. Continue with inhaler as needed for SOB. Stay well hydrated and rest well.  FU if symptoms not improved.        Follow up plan: Return if symptoms worsen or fail to improve.  This visit was completed via MyChart due to the restrictions of the COVID-19 pandemic. All issues as above were discussed and addressed. Physical exam was done as above through visual confirmation on MyChart. If it was felt that the patient should be evaluated in the office, they were directed there. The patient verbally consented to this visit. Location of the patient: Home Location of the provider: Office Those involved with this call:  Provider: Jon Billings, NP CMA: Yvonna Alanis, CMA Front Desk/Registration: Lynnell Catalan This encounter was conducted via video.  I spent 20 dedicated to the care of this patient on the  date of this encounter to include previsit review of symptoms, plan of care and follow up, face to face time with the patient, and post visit ordering of testing.

## 2023-03-02 NOTE — Telephone Encounter (Signed)
Scheduled appointment VV considering covid positive.

## 2023-03-09 ENCOUNTER — Encounter: Payer: Self-pay | Admitting: Gastroenterology

## 2023-03-09 ENCOUNTER — Telehealth: Payer: 59 | Admitting: Gastroenterology

## 2023-03-09 VITALS — Wt 217.0 lb

## 2023-03-09 DIAGNOSIS — D122 Benign neoplasm of ascending colon: Secondary | ICD-10-CM | POA: Diagnosis not present

## 2023-03-09 NOTE — Progress Notes (Signed)
Midge Minium, MD [address]  [address]  [place], [place] [address]  Main: [phone]  Fax: [phone]    Gastroenterology Virtual/Video Visit  Referring Provider:     Larae Grooms, NP Primary Care Physician:  Larae Grooms, NP Primary Gastroenterologist:  Dr.Audelia Knape Servando Snare Reason for Consultation:     Follow-up after colonoscopy        HPI:    Virtual Visit via Video Note Location of the patient: Work Location of provider: Office  Participating persons: The patient and myself.  I connected with Bianca Clay on 03/09/23 at  3:15 PM EDT by a video enabled telemedicine application and verified that I am speaking with the correct person using two identifiers.   I discussed the limitations of evaluation and management by telemedicine and the availability of in person appointments. The patient expressed understanding and agreed to proceed.  Verbal consent to proceed obtained.  History of Present Illness: Bianca Clay is a 57 y.o. female referred by Dr. Larae Grooms, NP  for consultation & management of follow-up for colonoscopy.  This patient underwent a colonoscopy due to a positive Cologuard test.  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  The patient had no sign of high-grade dysplasia or malignancy.  The patient did well after the procedure.  There is no worrisome features seen on the polyp except the villous architecture.  Past Medical History:  Diagnosis Date   Anxiety    Cervical dysplasia    Depression    Galactorrhea not associated with childbirth 2011   DR. BALL   History of mammogram 09/19/2013   BIRAD I   History of Papanicolaou smear of cervix 03/29/2012   -/-    Past Surgical History:  Procedure Laterality Date   CERVICAL CONE BIOPSY  1989   CESAREAN SECTION     X1   COLONOSCOPY WITH PROPOFOL N/A 01/15/2023   Procedure: COLONOSCOPY WITH PROPOFOL;  Surgeon: Midge Minium,  MD;  Location: [facility];  Service: Endoscopy;  Laterality: N/A;   HYSTEROSCOPY     POLYPECTOMY  01/15/2023   Procedure: POLYPECTOMY;  Surgeon: Midge Minium, MD;  Location: [facility];  Service: Endoscopy;;  CLIPS X 5   TONSILLECTOMY  1980   TUBAL LIGATION      Prior to Admission medications   Medication Sig Start Date End Date Taking? Authorizing Provider  albuterol (VENTOLIN HFA) 108 (90 Base) MCG/ACT inhaler Inhale 2 puffs into the lungs every 6 (six) hours as needed for wheezing or shortness of breath. 11/27/22  Yes Johnson, Megan P, DO  atorvastatin (LIPITOR) 10 MG tablet TAKE 1 TABLET BY MOUTH EVERY DAY 12/24/22  Yes Larae Grooms, NP  betamethasone dipropionate (DIPROLENE) 0.05 % ointment Please specify directions, refills and quantity 06/09/22  Yes Gabriel Cirri, NP  Cholecalciferol (VITAMIN D PO) Take 2,000 Units by mouth daily.    Yes [provider]  hydrOXYzine (ATARAX) 25 MG tablet Take 1 tablet (25 mg total) by mouth 3 (three) times daily as needed. 12/08/21  Yes Larae Grooms, NP  Multiple Vitamin (MULTIVITAMIN) capsule Take 1 capsule by mouth daily.   Yes [provider]  sertraline (ZOLOFT) 100 MG tablet TAKE 1 TABLET BY MOUTH EVERY DAY 12/24/22  Yes Larae Grooms, NP    Family History  Problem Relation Age of Onset   Diabetes Mother    Hypertension Mother    Diabetes Father  Hypertension Father    Depression Sister    Hypertension Sister    Allergies Son    Depression Sister    Breast cancer Neg Hx      Social History   Tobacco Use   Smoking status: Every Day    Packs/day: .5    Types: Cigarettes   Smokeless tobacco: Never   Tobacco comments:    counseling, habit replacement  Vaping Use   Vaping Use: Never used  Substance Use Topics   Alcohol use: No    Alcohol/week: 0.0 standard drinks of alcohol   Drug use: No    Allergies as of 03/09/2023   (No Known Allergies)    Review of Systems:    All systems  reviewed and negative except where noted in HPI.   Observations/Objective:  Labs: CBC    Component Value Date/Time   WBC 10.6 12/09/2022 0831   WBC 9.9 05/03/2012 1120   RBC 4.72 12/09/2022 0831   RBC 4.31 05/03/2012 1120   HGB 13.8 12/09/2022 0831   HCT 42.1 12/09/2022 0831   PLT 321 12/09/2022 0831   MCV 89 12/09/2022 0831   MCV 92 05/03/2012 1120   MCH 29.2 12/09/2022 0831   MCH 30.1 05/03/2012 1120   MCHC 32.8 12/09/2022 0831   MCHC 32.9 05/03/2012 1120   RDW 13.2 12/09/2022 0831   RDW 14.0 05/03/2012 1120   LYMPHSABS 3.6 (H) 12/09/2022 0831   EOSABS 0.3 12/09/2022 0831   BASOSABS 0.0 12/09/2022 0831   CMP     Component Value Date/Time   NA 140 12/09/2022 0831   K 4.2 12/09/2022 0831   CL 104 12/09/2022 0831   CO2 21 12/09/2022 0831   GLUCOSE 89 12/09/2022 0831   BUN 13 12/09/2022 0831   CREATININE 0.78 12/09/2022 0831   CALCIUM 9.2 12/09/2022 0831   PROT 6.0 12/09/2022 0831   ALBUMIN 4.2 12/09/2022 0831   AST 14 12/09/2022 0831   ALT 17 12/09/2022 0831   ALKPHOS 96 12/09/2022 0831   BILITOT 0.3 12/09/2022 0831   GFRNONAA 101 11/08/2020 1010   GFRAA 117 11/08/2020 1010    Imaging Studies: No results found.  Assessment and Plan:   Bianca Clay is a 57 y.o. y/o female who had a colonoscopy back in February for a positive Cologuard test.  The patient had a large polyp found that was removed in piecemeal fashion.  The patient will need a repeat colonoscopy in 6 months after the last colonoscopy then likely will need a repeat at 1 year.  The patient has been told the plan and agrees with it.  Follow Up Instructions:  I discussed the assessment and treatment plan with the patient. The patient was provided an opportunity to ask questions and all were answered. The patient agreed with the plan and demonstrated an understanding of the instructions.   The patient was advised to call back or seek an in-person evaluation if the symptoms worsen or if the  condition fails to improve as anticipated.  I provided 20 minutes of non-face-to-face time during this encounter including chart review In preparation for the encounter.   Midge Minium, MD  Speech recognition software was used to dictate the above note.

## 2023-04-03 DIAGNOSIS — L0102 Bockhart's impetigo: Secondary | ICD-10-CM | POA: Diagnosis not present

## 2023-04-03 DIAGNOSIS — L739 Follicular disorder, unspecified: Secondary | ICD-10-CM | POA: Diagnosis not present

## 2023-04-03 DIAGNOSIS — Z6832 Body mass index (BMI) 32.0-32.9, adult: Secondary | ICD-10-CM | POA: Diagnosis not present

## 2023-05-10 ENCOUNTER — Encounter: Payer: Self-pay | Admitting: Nurse Practitioner

## 2023-05-10 ENCOUNTER — Ambulatory Visit: Payer: 59 | Admitting: Nurse Practitioner

## 2023-05-10 VITALS — BP 134/80 | HR 62 | Temp 97.9°F | Wt 217.8 lb

## 2023-05-10 DIAGNOSIS — F3342 Major depressive disorder, recurrent, in full remission: Secondary | ICD-10-CM | POA: Diagnosis not present

## 2023-05-10 DIAGNOSIS — E559 Vitamin D deficiency, unspecified: Secondary | ICD-10-CM

## 2023-05-10 DIAGNOSIS — E669 Obesity, unspecified: Secondary | ICD-10-CM

## 2023-05-10 DIAGNOSIS — E782 Mixed hyperlipidemia: Secondary | ICD-10-CM

## 2023-05-10 MED ORDER — BETAMETHASONE DIPROPIONATE 0.05 % EX OINT: TOPICAL_OINTMENT | CUTANEOUS | 1 refills | Status: DC

## 2023-05-10 MED ORDER — SERTRALINE HCL 100 MG PO TABS: 100.0000 mg | ORAL_TABLET | Freq: Every day | ORAL | 1 refills | Status: DC

## 2023-05-10 NOTE — Progress Notes (Signed)
BP 134/80   Pulse 62   Temp 97.9 F (36.6 C) (Oral)   Wt 217 lb 12.8 oz (98.8 kg)   LMP 03/15/2015   SpO2 97%   BMI 34.11 kg/m    Subjective:    Patient ID: Bianca Clay, female    DOB: 1966-04-09, 57 y.o.   MRN: [id number]  HPI: Bianca Clay is a 57 y.o. female  Chief Complaint  Patient presents with   Hyperlipidemia   Depression   DEPRESSION Patient states her mood is pretty good. She uses the hydroxyzine on occasion.   She feels like the zoloft is still working well for her.  Denies concerns at visit today. Denies SI.    Depression Screen done today and results listed below:     12/09/2022    8:19 AM 10/20/2022   11:31 AM 06/09/2022    8:10 AM 12/08/2021   11:04 AM 11/08/2020    9:38 AM  Depression screen PHQ 2/9  Decreased Interest 0 1 1 0 1  Down, Depressed, Hopeless 0 1 1 0 1  PHQ - 2 Score 0 2 2 0 2  Altered sleeping 1 1 3 2 1   Tired, decreased energy 0 2 3 2  0  Change in appetite 0 1 1 0 0  Feeling bad or failure about yourself  0 0 1 0 0  Trouble concentrating 0 0 0 0 0  Moving slowly or fidgety/restless 0 0 0 0 0  Suicidal thoughts 0 0 0 0 0  PHQ-9 Score 1 6 10 4 3   Difficult doing work/chores Not difficult at all Not difficult at all Not difficult at all Not difficult at all Not difficult at all   Relevant past medical, surgical, family and social history reviewed and updated as indicated. Interim medical history since our last visit reviewed. Allergies and medications reviewed and updated.  Review of Systems  Psychiatric/Behavioral:  Positive for dysphoric mood. Negative for suicidal ideas. The patient is nervous/anxious.     Per HPI unless specifically indicated above     Objective:    BP 134/80   Pulse 62   Temp 97.9 F (36.6 C) (Oral)   Wt 217 lb 12.8 oz (98.8 kg)   LMP 03/15/2015   SpO2 97%   BMI 34.11 kg/m   Wt Readings from Last 3 Encounters:  05/10/23 217 lb 12.8 oz (98.8 kg)  03/09/23 217 lb (98.4 kg)  01/15/23 217 lb  (98.4 kg)    Physical Exam Vitals and nursing note reviewed.  Constitutional:      General: She is not in acute distress.    Appearance: Normal appearance. She is obese. She is not ill-appearing, toxic-appearing or diaphoretic.  HENT:     Head: Normocephalic.     Right Ear: External ear normal.     Left Ear: External ear normal.     Nose: Nose normal.     Mouth/Throat:     Mouth: Mucous membranes are moist.     Pharynx: Oropharynx is clear.  Eyes:     General:        Right eye: No discharge.        Left eye: No discharge.     Extraocular Movements: Extraocular movements intact.     Conjunctiva/sclera: Conjunctivae normal.     Pupils: Pupils are equal, round, and reactive to light.  Cardiovascular:     Rate and Rhythm: Normal rate and regular rhythm.     Heart sounds: No murmur heard.  Pulmonary:     Effort: Pulmonary effort is normal. No respiratory distress.     Breath sounds: Normal breath sounds. No wheezing or rales.  Musculoskeletal:     Cervical back: Normal range of motion and neck supple.  Skin:    General: Skin is warm and dry.     Capillary Refill: Capillary refill takes less than 2 seconds.  Neurological:     General: No focal deficit present.     Mental Status: She is alert and oriented to person, place, and time. Mental status is at baseline.  Psychiatric:        Mood and Affect: Mood normal.        Behavior: Behavior normal.        Thought Content: Thought content normal.        Judgment: Judgment normal.     Results for orders placed or performed during the hospital encounter of 01/15/23  Surgical pathology  Result Value Ref Range   SURGICAL PATHOLOGY      SURGICAL PATHOLOGY CASE: [id number] PATIENT: [facility] Surgical Pathology Report     Specimen Submitted: A. Colon polyp, ascending; hot snare B. Colon polyp, ascending center; cbx  Clinical History: Screening colonoscopy, positive cologuard      DIAGNOSIS: A. COLON POLYP,  ASCENDING; SNARE BIOPSY: - MULTIPLE FRAGMENTS OF TUBULOVILLOUS ADENOMA. - NEGATIVE FOR HIGH-GRADE DYSPLASIA AND MALIGNANCY.  B. COLON POLYP, ASCENDING, CENTER; BIOPSY: - FEW FRAGMENTS OF BENIGN COLONIC MUCOSA AND UNDERLYING FIBROMUSCULAR STROMA. - NEGATIVE FOR ATYPIA AND MALIGNANCY.   GROSS DESCRIPTION: A. Labeled: Ascending colon polyp-hot snare Received: Formalin Collection time: 8:59 AM on 01/15/2023 Placed into formalin time: 8:59 AM on 01/15/2023 Tissue fragment(s): Multiple Size: Aggregate, 2.5 x 2.0 x 1.5 cm Description: Received are tan soft tissue fragments.  The largest fragment is trisected Entirely submitted in 3 cassettes with smaller fragments in A1 and A2, a nd largest fragment in A3.  B. Labeled: Ascending colon polyp center-cold biopsy Received: Formalin Collection time: 9:41 AM on 01/15/2023 Placed into formalin time: 9:41 AM on 01/15/2023 Tissue fragment(s): Multiple Size: Aggregate, 0.8 x 0.2 x 0.1 cm Description: Received are tan to brown soft tissue fragments Entirely submitted in 1 cassette.  CM 01/18/2023  Final Diagnosis performed by Redmond Pulling, MD.   Electronically signed 01/19/2023 2:35:42PM The electronic signature indicates that the named Attending Pathologist has evaluated the specimen Technical component performed at [facility], [address] [place], [place] [address] Lab: [phone] Dir: Jolene Schimke, MD, MMM  Professional component performed at [facility], [facility], [address] [place], [place], [place] [address] Lab: [phone] Dir: Beryle Quant, MD       Assessment & Plan:   Problem List Items Addressed This Visit       Other   Vitamin D deficiency - Primary    Labs ordered at visit today.  Will make recommendations based on lab results.         Relevant Orders   Vitamin D (25 hydroxy)   Depression    Chronic.  Controlled.  Continue with current medication regimen on Zoloft 100mg  daily and PRN Hydroxyzine.  Refills sent today.  Labs ordered today.  Return to clinic in 6 months for reevaluation.  Call sooner if concerns arise.        Relevant Medications   sertraline (ZOLOFT) 100 MG tablet   Other Relevant Orders   Comp Met (CMET)   Hyperlipidemia    Chronic.  Controlled.  Continue with current medication regimen of  Atorvastatin daily.  Labs ordered today.  Return to clinic in 6 months for reevaluation.  Call sooner if concerns arise.        Relevant Orders   Lipid Profile   Obesity (BMI 35.0-39.9 without comorbidity)    Recommended eating smaller high protein, low fat meals more frequently and exercising 30 mins a day 5 times a week with a goal of 10-15lb weight loss in the next 3 months.       Relevant Orders   Comp Met (CMET)     Follow up plan: Return in about 6 months (around 11/09/2023) for Physical and Fasting labs.

## 2023-05-10 NOTE — Assessment & Plan Note (Signed)
Labs ordered at visit today.  Will make recommendations based on lab results.   

## 2023-05-10 NOTE — Assessment & Plan Note (Signed)
Chronic.  Controlled.  Continue with current medication regimen of Atorvastatin daily.  Labs ordered today.  Return to clinic in 6 months for reevaluation.  Call sooner if concerns arise.   

## 2023-05-10 NOTE — Assessment & Plan Note (Signed)
Recommended eating smaller high protein, low fat meals more frequently and exercising 30 mins a day 5 times a week with a goal of 10-15lb weight loss in the next 3 months.  

## 2023-05-10 NOTE — Assessment & Plan Note (Signed)
Chronic.  Controlled.  Continue with current medication regimen on Zoloft 100mg  daily and PRN Hydroxyzine. Refills sent today.  Labs ordered today.  Return to clinic in 6 months for reevaluation.  Call sooner if concerns arise.

## 2023-05-11 LAB — COMPREHENSIVE METABOLIC PANEL
ALT: 19 IU/L (ref 0–32)
AST: 19 IU/L (ref 0–40)
Albumin/Globulin Ratio: 2
Albumin: 4.2 g/dL (ref 3.8–4.9)
Alkaline Phosphatase: 119 IU/L (ref 44–121)
BUN/Creatinine Ratio: 15 (ref 9–23)
BUN: 10 mg/dL (ref 6–24)
Bilirubin Total: 0.3 mg/dL (ref 0.0–1.2)
CO2: 25 mmol/L (ref 20–29)
Calcium: 9.6 mg/dL (ref 8.7–10.2)
Chloride: 106 mmol/L (ref 96–106)
Creatinine, Ser: 0.66 mg/dL (ref 0.57–1.00)
Globulin, Total: 2.1 g/dL (ref 1.5–4.5)
Glucose: 96 mg/dL (ref 70–99)
Potassium: 4.3 mmol/L (ref 3.5–5.2)
Sodium: 143 mmol/L (ref 134–144)
Total Protein: 6.3 g/dL (ref 6.0–8.5)
eGFR: 104 mL/min/{1.73_m2} (ref >59)

## 2023-05-11 LAB — VITAMIN D 25 HYDROXY (VIT D DEFICIENCY, FRACTURES): Vit D, 25-Hydroxy: 41.2 ng/mL (ref 30.0–100.0)

## 2023-05-11 LAB — LIPID PANEL
Chol/HDL Ratio: 5.1 ratio — ABNORMAL HIGH (ref 0.0–4.4)
Cholesterol, Total: 173 mg/dL (ref 100–199)
HDL: 34 mg/dL — ABNORMAL LOW (ref >39)
LDL Chol Calc (NIH): 107 mg/dL — ABNORMAL HIGH (ref 0–99)
Triglycerides: 181 mg/dL — ABNORMAL HIGH (ref 0–149)
VLDL Cholesterol Cal: 32 mg/dL (ref 5–40)

## 2023-05-11 NOTE — Progress Notes (Signed)
Hi Bianca Clay.  It was nice to see you yesterday.  Your lab work looks good.  No concerns at this time. Continue with your current medication regimen.  Follow up as discussed.  Please let me know if you have any questions.   

## 2023-06-29 ENCOUNTER — Ambulatory Visit (INDEPENDENT_AMBULATORY_CARE_PROVIDER_SITE_OTHER): Payer: 59 | Admitting: Family Medicine

## 2023-06-29 ENCOUNTER — Encounter: Payer: Self-pay | Admitting: Family Medicine

## 2023-06-29 VITALS — BP 118/78 | HR 73 | Wt 218.6 lb

## 2023-06-29 DIAGNOSIS — L732 Hidradenitis suppurativa: Secondary | ICD-10-CM

## 2023-06-29 MED ORDER — PREDNISONE 10 MG PO TABS: ORAL_TABLET | ORAL | 0 refills | Status: DC

## 2023-06-29 MED ORDER — DOXYCYCLINE HYCLATE 100 MG PO TABS: 100.0000 mg | ORAL_TABLET | Freq: Two times a day (BID) | ORAL | 0 refills | Status: AC

## 2023-06-29 MED ORDER — TRIAMCINOLONE ACETONIDE 40 MG/ML IJ SUSP
40.0000 mg | Freq: Once | INTRAMUSCULAR | Status: AC
  Administered 2023-06-29: 40 mg via INTRAMUSCULAR

## 2023-06-29 NOTE — Addendum Note (Signed)
Addended by: Prescott Gum on: 06/29/2023 02:38 PM   Modules accepted: Level of Service

## 2023-06-29 NOTE — Progress Notes (Signed)
BP 118/78   Pulse 73   Wt 218 lb 9.6 oz (99.2 kg)   LMP 03/15/2015   SpO2 96%   BMI 34.24 kg/m    Subjective:    Patient ID: Bianca Clay, female    DOB: 01-25-1966, 57 y.o.   MRN: [id number]  HPI: Bianca Clay is a 57 y.o. female  Chief Complaint  Patient presents with   hidradenitis suppurativa    Patient says she was seen about a month or two ago at [facility]. Patient says at that time she only noticed one area. But, now for the past two weeks she has noticed more area under her L axilla area and says when she tries to pop it, the fluid is green.    ABSCESS She was treated with Keflex 500 mg TID for 10 days 2 months ago for x1 abscess to the left axilla, admits the Keflex helped with the pain, but the abscess remained. She notices they appeared after shaving her underarms but only on the left side. She now has x7 and has tried popping them, green drainage occurred. 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 Duration: 3 months Location: left axilla Pain:  yes when arms are down and rubbing against it Quality:  aching, sore, tender, and throbbing Severity: 8/10 Redness:  yes Swelling:  yes Warmth:  no Oozing:  yes Pus:  no Treatments attempted:Boil ease ointment Past similar infections:  no Past MRSA skin infections:  no History of trauma in area:  no Fevers:  no Nausea/vomiting:  no  Relevant past medical, surgical, family and social history reviewed and updated as indicated. Interim medical history since our last visit reviewed. Allergies and medications reviewed and updated.  Review of Systems  Constitutional:  Negative for chills, diaphoresis and fever.  Respiratory: Negative.    Cardiovascular: Negative.   Gastrointestinal:  Negative for nausea and vomiting.  Skin:  Positive for wound.    Per HPI unless specifically indicated above     Objective:    BP 118/78   Pulse 73   Wt 218 lb 9.6 oz (99.2  kg)   LMP 03/15/2015   SpO2 96%   BMI 34.24 kg/m   Wt Readings from Last 3 Encounters:  06/29/23 218 lb 9.6 oz (99.2 kg)  05/10/23 217 lb 12.8 oz (98.8 kg)  03/09/23 217 lb (98.4 kg)    Physical Exam Vitals and nursing note reviewed.  Constitutional:      General: She is awake. She is not in acute distress.    Appearance: Normal appearance. She is well-developed and well-groomed. She is obese. She is not ill-appearing.  HENT:     Head: Normocephalic and atraumatic.     Right Ear: Hearing and external ear normal. No drainage.     Left Ear: Hearing and external ear normal. No drainage.     Nose: Nose normal.  Eyes:     General: Lids are normal.        Right eye: No discharge.        Left eye: No discharge.     Conjunctiva/sclera: Conjunctivae normal.  Cardiovascular:     Rate and Rhythm: Normal rate and regular rhythm.     Pulses:          Radial pulses are 2+ on the right side and 2+ on the left side.       Posterior tibial pulses are 2+ on the right side  and 2+ on the left side.     Heart sounds: Normal heart sounds, S1 normal and S2 normal. No murmur heard.    No gallop.  Pulmonary:     Effort: Pulmonary effort is normal. No accessory muscle usage or respiratory distress.     Breath sounds: Normal breath sounds. No wheezing, rhonchi or rales.  Musculoskeletal:        General: Normal range of motion.     Cervical back: Full passive range of motion without pain and normal range of motion.     Right lower leg: No edema.     Left lower leg: No edema.  Skin:    General: Skin is warm and dry.     Capillary Refill: Capillary refill takes less than 2 seconds.     Findings: Abscess and rash present. Rash is scaling.     Comments: X7 inflamed papules/nodules under left axilla Psoriasis on bilateral elbows  Neurological:     Mental Status: She is alert and oriented to person, place, and time.  Psychiatric:        Attention and Perception: Attention normal.        Mood and  Affect: Mood normal.        Speech: Speech normal.        Behavior: Behavior normal. Behavior is cooperative.        Thought Content: Thought content normal.     Results for orders placed or performed in visit on 05/10/23  Comp Met (CMET)  Result Value Ref Range   Glucose 96 70 - 99 mg/dL   BUN 10 6 - 24 mg/dL   Creatinine, Ser 1.61 0.57 - 1.00 mg/dL   eGFR 096 >04 VW/UJW/1.19   BUN/Creatinine Ratio 15 9 - 23   Sodium 143 134 - 144 mmol/L   Potassium 4.3 3.5 - 5.2 mmol/L   Chloride 106 96 - 106 mmol/L   CO2 25 20 - 29 mmol/L   Calcium 9.6 8.7 - 10.2 mg/dL   Total Protein 6.3 6.0 - 8.5 g/dL   Albumin 4.2 3.8 - 4.9 g/dL   Globulin, Total 2.1 1.5 - 4.5 g/dL   Albumin/Globulin Ratio 2.0    Bilirubin Total 0.3 0.0 - 1.2 mg/dL   Alkaline Phosphatase 119 44 - 121 IU/L   AST 19 0 - 40 IU/L   ALT 19 0 - 32 IU/L  Lipid Profile  Result Value Ref Range   Cholesterol, Total 173 100 - 199 mg/dL   Triglycerides 147 (H) 0 - 149 mg/dL   HDL 34 (L) >82 mg/dL   VLDL Cholesterol Cal 32 5 - 40 mg/dL   LDL Chol Calc (NIH) 956 (H) 0 - 99 mg/dL   LDL CALC COMMENT: CANCELED    Chol/HDL Ratio 5.1 (H) 0.0 - 4.4 ratio  Vitamin D (25 hydroxy)  Result Value Ref Range   Vit D, 25-Hydroxy 41.2 30.0 - 100.0 ng/mL      Assessment & Plan:   Problem List Items Addressed This Visit     Hidradenitis suppurativa of left axilla - Primary    Acute, ongoing. Kenalog 40 mg given in office today. Doxycycline 100 mg BID for 10 days, 6 day prednisone taper ordered. Recommend cleanse area with Hibiclens cleanser daily. Discussed smoking cessation and weight loss for prevention. Recommend f/u if no improvement.       Relevant Medications   triamcinolone acetonide (KENALOG-40) injection 40 mg (Start on 06/29/2023  9:30 AM)   doxycycline (VIBRA-TABS) 100 MG  tablet   predniSONE (DELTASONE) 10 MG tablet     Follow up plan: Return if symptoms worsen or fail to improve.

## 2023-06-29 NOTE — Assessment & Plan Note (Signed)
Acute, ongoing. Kenalog 40 mg given in office today. Doxycycline 100 mg BID for 10 days, 6 day prednisone taper ordered. Recommend cleanse area with Hibiclens cleanser daily. Discussed smoking cessation and weight loss for prevention. Recommend f/u if no improvement.

## 2023-11-09 ENCOUNTER — Encounter: Payer: Self-pay | Admitting: Nurse Practitioner

## 2023-11-09 ENCOUNTER — Telehealth: Payer: Self-pay

## 2023-11-09 ENCOUNTER — Other Ambulatory Visit: Payer: Self-pay | Admitting: Nurse Practitioner

## 2023-11-09 ENCOUNTER — Ambulatory Visit: Payer: 59 | Admitting: Nurse Practitioner

## 2023-11-09 VITALS — BP 134/84 | HR 69 | Temp 97.8°F | Ht 67.0 in | Wt 232.8 lb

## 2023-11-09 DIAGNOSIS — Z23 Encounter for immunization: Secondary | ICD-10-CM | POA: Diagnosis not present

## 2023-11-09 DIAGNOSIS — F419 Anxiety disorder, unspecified: Secondary | ICD-10-CM | POA: Diagnosis not present

## 2023-11-09 DIAGNOSIS — F331 Major depressive disorder, recurrent, moderate: Secondary | ICD-10-CM | POA: Diagnosis not present

## 2023-11-09 DIAGNOSIS — E559 Vitamin D deficiency, unspecified: Secondary | ICD-10-CM | POA: Diagnosis not present

## 2023-11-09 DIAGNOSIS — E221 Hyperprolactinemia: Secondary | ICD-10-CM | POA: Diagnosis not present

## 2023-11-09 DIAGNOSIS — Z6835 Body mass index (BMI) 35.0-35.9, adult: Secondary | ICD-10-CM | POA: Diagnosis not present

## 2023-11-09 DIAGNOSIS — E782 Mixed hyperlipidemia: Secondary | ICD-10-CM

## 2023-11-09 DIAGNOSIS — E66812 Obesity, class 2: Secondary | ICD-10-CM

## 2023-11-09 DIAGNOSIS — Z1231 Encounter for screening mammogram for malignant neoplasm of breast: Secondary | ICD-10-CM

## 2023-11-09 MED ORDER — BETAMETHASONE DIPROPIONATE 0.05 % EX OINT: TOPICAL_OINTMENT | CUTANEOUS | 1 refills | Status: DC

## 2023-11-09 MED ORDER — ATORVASTATIN CALCIUM 10 MG PO TABS: 10.0000 mg | ORAL_TABLET | Freq: Every day | ORAL | 1 refills | Status: DC

## 2023-11-09 MED ORDER — SERTRALINE HCL 100 MG PO TABS: 100.0000 mg | ORAL_TABLET | Freq: Every day | ORAL | 1 refills | Status: DC

## 2023-11-09 MED ORDER — ALBUTEROL SULFATE HFA 108 (90 BASE) MCG/ACT IN AERS: 2.0000 | INHALATION_SPRAY | Freq: Four times a day (QID) | RESPIRATORY_TRACT | 1 refills | Status: AC | PRN

## 2023-11-09 NOTE — Assessment & Plan Note (Signed)
Chronic.  Controlled.  Continue with current medication regimen of Zoloft 100mg daily.  Labs ordered today.  Return to clinic in 6 months for reevaluation.  Call sooner if concerns arise.   

## 2023-11-09 NOTE — Assessment & Plan Note (Signed)
Chronic.  Controlled.  Continue with current medication regimen of Zoloft 100mg .   Return to clinic in 6 months for reevaluation.  Call sooner if concerns arise.

## 2023-11-09 NOTE — Assessment & Plan Note (Signed)
Chronic.  Controlled.  Continue with current medication regimen.  Labs ordered today.  Return to clinic in 6 months for reevaluation.  Call sooner if concerns arise.  ? ?

## 2023-11-09 NOTE — Telephone Encounter (Signed)
Patient is scheduled for Thursday January 2nd at 9:40 am. Patient will be notified via MyChart.

## 2023-11-09 NOTE — Telephone Encounter (Signed)
-----   Message from Larae Grooms sent at 11/09/2023  8:40 AM EST ----- Can you schedule her mammogram for a Thursday?

## 2023-11-09 NOTE — Assessment & Plan Note (Signed)
Chronic. Taking 2000IU daily.  Labs ordered today today. Will make recommendations based on labs.

## 2023-11-09 NOTE — Assessment & Plan Note (Signed)
Recommended eating smaller high protein, low fat meals more frequently and exercising 30 mins a day 5 times a week with a goal of 10-15lb weight loss in the next 3 months.  

## 2023-11-09 NOTE — Progress Notes (Signed)
BP 134/84 (BP Location: Left Arm, Patient Position: Sitting, Cuff Size: Normal)   Pulse 69   Temp 97.8 F (36.6 C) (Oral)   Ht 5\' 7"  (1.702 m)   Wt 232 lb 12.8 oz (105.6 kg)   LMP 03/15/2015   SpO2 97%   BMI 36.46 kg/m    Subjective:    Patient ID: Bianca Clay, female    DOB: 10-Oct-1966, 57 y.o.   MRN: [id number]  HPI: Bianca Clay is a 57 y.o. female  Chief Complaint  Patient presents with   6 month follow up    Hidradenitis suppurativa   Medication Refill    Albuterol inhaler,    DEPRESSION Patient states her mood is pretty good. She has changed jobs.  No longer uses PRN hydroxyzine.  She feels like the zoloft is still working well for her.  Denies concerns at visit today. Denies SI.    Depression Screen done today and results listed below:     12/09/2022    8:19 AM 10/20/2022   11:31 AM 06/09/2022    8:10 AM 12/08/2021   11:04 AM 11/08/2020    9:38 AM  Depression screen PHQ 2/9  Decreased Interest 0 1 1 0 1  Down, Depressed, Hopeless 0 1 1 0 1  PHQ - 2 Score 0 2 2 0 2  Altered sleeping 1 1 3 2 1   Tired, decreased energy 0 2 3 2  0  Change in appetite 0 1 1 0 0  Feeling bad or failure about yourself  0 0 1 0 0  Trouble concentrating 0 0 0 0 0  Moving slowly or fidgety/restless 0 0 0 0 0  Suicidal thoughts 0 0 0 0 0  PHQ-9 Score 1 6 10 4 3   Difficult doing work/chores Not difficult at all Not difficult at all Not difficult at all Not difficult at all Not difficult at all   Relevant past medical, surgical, family and social history reviewed and updated as indicated. Interim medical history since our last visit reviewed. Allergies and medications reviewed and updated.  Review of Systems  Psychiatric/Behavioral:  Positive for dysphoric mood. Negative for suicidal ideas. The patient is nervous/anxious.     Per HPI unless specifically indicated above     Objective:    BP 134/84 (BP Location: Left Arm, Patient Position: Sitting, Cuff Size: Normal)    Pulse 69   Temp 97.8 F (36.6 C) (Oral)   Ht 5\' 7"  (1.702 m)   Wt 232 lb 12.8 oz (105.6 kg)   LMP 03/15/2015   SpO2 97%   BMI 36.46 kg/m   Wt Readings from Last 3 Encounters:  11/09/23 232 lb 12.8 oz (105.6 kg)  06/29/23 218 lb 9.6 oz (99.2 kg)  05/10/23 217 lb 12.8 oz (98.8 kg)    Physical Exam Vitals and nursing note reviewed.  Constitutional:      General: She is not in acute distress.    Appearance: Normal appearance. She is obese. She is not ill-appearing, toxic-appearing or diaphoretic.  HENT:     Head: Normocephalic.     Right Ear: External ear normal.     Left Ear: External ear normal.     Nose: Nose normal.     Mouth/Throat:     Mouth: Mucous membranes are moist.     Pharynx: Oropharynx is clear.  Eyes:     General:        Right eye: No discharge.  Left eye: No discharge.     Extraocular Movements: Extraocular movements intact.     Conjunctiva/sclera: Conjunctivae normal.     Pupils: Pupils are equal, round, and reactive to light.  Cardiovascular:     Rate and Rhythm: Normal rate and regular rhythm.     Heart sounds: No murmur heard. Pulmonary:     Effort: Pulmonary effort is normal. No respiratory distress.     Breath sounds: Normal breath sounds. No wheezing or rales.  Musculoskeletal:     Cervical back: Normal range of motion and neck supple.  Skin:    General: Skin is warm and dry.     Capillary Refill: Capillary refill takes less than 2 seconds.  Neurological:     General: No focal deficit present.     Mental Status: She is alert and oriented to person, place, and time. Mental status is at baseline.  Psychiatric:        Mood and Affect: Mood normal.        Behavior: Behavior normal.        Thought Content: Thought content normal.        Judgment: Judgment normal.     Results for orders placed or performed in visit on 05/10/23  Comp Met (CMET)  Result Value Ref Range   Glucose 96 70 - 99 mg/dL   BUN 10 6 - 24 mg/dL   Creatinine, Ser 1.88  0.57 - 1.00 mg/dL   eGFR 416 >60 YT/KZS/0.10   BUN/Creatinine Ratio 15 9 - 23   Sodium 143 134 - 144 mmol/L   Potassium 4.3 3.5 - 5.2 mmol/L   Chloride 106 96 - 106 mmol/L   CO2 25 20 - 29 mmol/L   Calcium 9.6 8.7 - 10.2 mg/dL   Total Protein 6.3 6.0 - 8.5 g/dL   Albumin 4.2 3.8 - 4.9 g/dL   Globulin, Total 2.1 1.5 - 4.5 g/dL   Albumin/Globulin Ratio 2.0    Bilirubin Total 0.3 0.0 - 1.2 mg/dL   Alkaline Phosphatase 119 44 - 121 IU/L   AST 19 0 - 40 IU/L   ALT 19 0 - 32 IU/L  Lipid Profile  Result Value Ref Range   Cholesterol, Total 173 100 - 199 mg/dL   Triglycerides 932 (H) 0 - 149 mg/dL   HDL 34 (L) >35 mg/dL   VLDL Cholesterol Cal 32 5 - 40 mg/dL   LDL Chol Calc (NIH) 573 (H) 0 - 99 mg/dL   LDL CALC COMMENT: CANCELED    Chol/HDL Ratio 5.1 (H) 0.0 - 4.4 ratio  Vitamin D (25 hydroxy)  Result Value Ref Range   Vit D, 25-Hydroxy 41.2 30.0 - 100.0 ng/mL      Assessment & Plan:   Problem List Items Addressed This Visit       Endocrine   Hyperprolactinemia (HCC) - Primary    Chronic.  Controlled.  Continue with current medication regimen.  Labs ordered today.  Return to clinic in 6 months for reevaluation.  Call sooner if concerns arise.          Other   Vitamin D deficiency    Chronic. Taking 2000IU daily.  Labs ordered today today. Will make recommendations based on labs.       Relevant Orders   Vitamin D (25 hydroxy)   Chronic anxiety    Chronic.  Controlled.  Continue with current medication regimen of Zoloft 100mg .   Return to clinic in 6 months for reevaluation.  Call sooner if  concerns arise.        Relevant Medications   sertraline (ZOLOFT) 100 MG tablet   Other Relevant Orders   Comp Met (CMET)   Hyperlipidemia    Chronic.  Controlled.  Continue with current medication regimen of Atorvastatin daily.  Labs ordered today.  Return to clinic in 6 months for reevaluation.  Call sooner if concerns arise.       Relevant Medications   atorvastatin  (LIPITOR) 10 MG tablet   Other Relevant Orders   Lipid Profile   Class 2 severe obesity due to excess calories with serious comorbidity and body mass index (BMI) of 35.0 to 35.9 in adult [facility])    Recommended eating smaller high protein, low fat meals more frequently and exercising 30 mins a day 5 times a week with a goal of 10-15lb weight loss in the next 3 months.       Moderate episode of recurrent major depressive disorder (HCC)    Chronic.  Controlled.  Continue with current medication regimen of Zoloft 100mg  daily.  Labs ordered today.  Return to clinic in 6 months for reevaluation.  Call sooner if concerns arise.        Relevant Medications   sertraline (ZOLOFT) 100 MG tablet   Other Visit Diagnoses     Need for influenza vaccination       Relevant Orders   Flu vaccine trivalent PF, 6mos and older(Flulaval,Afluria,Fluarix,Fluzone)   Encounter for screening mammogram for malignant neoplasm of breast       Relevant Orders   MM 3D SCREENING MAMMOGRAM BILATERAL BREAST        Follow up plan: Return in about 6 months (around 05/09/2024) for Physical and Fasting labs.

## 2023-11-09 NOTE — Assessment & Plan Note (Signed)
Chronic.  Controlled.  Continue with current medication regimen of Atorvastatin daily.  Labs ordered today.  Return to clinic in 6 months for reevaluation.  Call sooner if concerns arise.   

## 2023-11-10 LAB — COMPREHENSIVE METABOLIC PANEL
ALT: 19 [IU]/L (ref 0–32)
AST: 18 [IU]/L (ref 0–40)
Albumin: 4.4 g/dL (ref 3.8–4.9)
Alkaline Phosphatase: 131 [IU]/L — ABNORMAL HIGH (ref 44–121)
BUN/Creatinine Ratio: 15 (ref 9–23)
BUN: 11 mg/dL (ref 6–24)
Bilirubin Total: 0.2 mg/dL (ref 0.0–1.2)
CO2: 22 mmol/L (ref 20–29)
Calcium: 10.1 mg/dL (ref 8.7–10.2)
Chloride: 104 mmol/L (ref 96–106)
Creatinine, Ser: 0.71 mg/dL (ref 0.57–1.00)
Globulin, Total: 2 g/dL (ref 1.5–4.5)
Glucose: 73 mg/dL (ref 70–99)
Potassium: 4.7 mmol/L (ref 3.5–5.2)
Sodium: 140 mmol/L (ref 134–144)
Total Protein: 6.4 g/dL (ref 6.0–8.5)
eGFR: 100 mL/min/{1.73_m2} (ref >59)

## 2023-11-10 LAB — LIPID PANEL
Chol/HDL Ratio: 5.6 {ratio} — ABNORMAL HIGH (ref 0.0–4.4)
Cholesterol, Total: 191 mg/dL (ref 100–199)
HDL: 34 mg/dL — ABNORMAL LOW (ref >39)
LDL Chol Calc (NIH): 116 mg/dL — ABNORMAL HIGH (ref 0–99)
Triglycerides: 233 mg/dL — ABNORMAL HIGH (ref 0–149)
VLDL Cholesterol Cal: 41 mg/dL — ABNORMAL HIGH (ref 5–40)

## 2023-11-10 LAB — VITAMIN D 25 HYDROXY (VIT D DEFICIENCY, FRACTURES): Vit D, 25-Hydroxy: 37.8 ng/mL (ref 30.0–100.0)

## 2023-11-10 MED ORDER — TRIAMCINOLONE ACETONIDE 0.5 % EX OINT: TOPICAL_OINTMENT | CUTANEOUS | 0 refills | Status: AC

## 2023-11-10 NOTE — Addendum Note (Signed)
Addended by: Larae Grooms on: 11/10/2023 11:58 AM   Modules accepted: Orders

## 2023-11-10 NOTE — Telephone Encounter (Signed)
Requested medication (s) are due for refill today: No  Requested medication (s) are on the active medication list: No    Last refill: 11/09/23  6g  1 refill  Future visit scheduled yes 05/12/23  Notes to clinic:   Pharmacy comment: Alternative Requested:THE PRESCRIBED MEDICATION IS NOT COVERED BY INSURANCE. PLEASE CONSIDER CHANGING TO ONE OF THE SUGGESTED COVERED ALTERNATIVES.    Requested Prescriptions  Pending Prescriptions Disp Refills   triamcinolone ointment (KENALOG) 0.5 % [Pharmacy Med Name: TRIAMCINOLONE 0.5% OINTMENT]  0     Not Delegated - Dermatology:  Corticosteroids Failed - 11/09/2023  8:49 AM      Failed - This refill cannot be delegated      Passed - Valid encounter within last 12 months    Recent Outpatient Visits           Yesterday Hyperprolactinemia [facility])   [place] [facility] Larae Grooms, NP   4 months ago Hidradenitis suppurativa of left axilla   [place] [facility] Pearley, Sherran Needs, NP   6 months ago Vitamin D deficiency   [facility] Larae Grooms, NP   8 months ago COVID-19   [facility] Larae Grooms, NP   11 months ago Annual physical exam   [place] [facility] Larae Grooms, NP       Future Appointments             In 6 months Larae Grooms, NP [facility]

## 2023-11-22 ENCOUNTER — Ambulatory Visit: Payer: 59 | Admitting: Nurse Practitioner

## 2023-11-22 ENCOUNTER — Ambulatory Visit: Payer: Self-pay

## 2023-11-22 VITALS — BP 138/77 | HR 86 | Temp 98.1°F | Ht 68.0 in | Wt 232.4 lb

## 2023-11-22 DIAGNOSIS — L0292 Furuncle, unspecified: Secondary | ICD-10-CM | POA: Diagnosis not present

## 2023-11-22 MED ORDER — SULFAMETHOXAZOLE-TRIMETHOPRIM 800-160 MG PO TABS: 1.0000 | ORAL_TABLET | Freq: Two times a day (BID) | ORAL | 0 refills | Status: DC

## 2023-11-22 NOTE — Telephone Encounter (Signed)
Needs an appt. I will see her at 120 today.

## 2023-11-22 NOTE — Telephone Encounter (Signed)
Pt accepted that appointment time.

## 2023-11-22 NOTE — Telephone Encounter (Signed)
Summary: wants antibiotics   Pt called saying she has boils under her arms.  [name] gives her antibiotics.  She is asking that she does that again.  [facility]  CB#  [phone]         Chief Complaint: Boils - 1 under left arm, 3 under right underarm. Penny size. Small amount of drainage. No availability. Asking foe antibiotic to se sent in. "She has treated these for me a few months ago." Symptoms: Above Frequency: Last week Pertinent Negatives: Patient denies fever Disposition: [] ED /[] Urgent Care (no appt availability in office) / [] Appointment(In office/virtual)/ []  [facility]/ [] Home Care/ [] Refused Recommended Disposition /[] [facility]/ [x]  Follow-up with PCP Additional Notes: Please advise pt.  Reason for Disposition  2 or more boils  Answer Assessment - Initial Assessment Questions 1. APPEARANCE of BOIL: "What does the boil look like?"      Round 2. LOCATION: "Where is the boil located?"      Under both arms 3. NUMBER: "How many boils are there?"      4 4. SIZE: "How big is the boil?" (e.g., inches, cm; compare to size of a coin or other object)     Penny size 5. ONSET: "When did the boil start?"     Last week 6. PAIN: "Is there any pain?" If Yes, ask: "How bad is the pain?"   (Scale 1-10; or mild, moderate, severe)     Moderate 7. FEVER: "Do you have a fever?" If Yes, ask: "What is it, how was it measured, and when did it start?"      No 8. SOURCE: "Have you been around anyone with boils or other Staph infections?" "Have you ever had boils before?"     No 9. OTHER SYMPTOMS: "Do you have any other symptoms?" (e.g., shaking chills, weakness, rash elsewhere on body)     Mild drainage 10. PREGNANCY: "Is there any chance you are pregnant?" "When was your last menstrual period?"       No  Protocols used: Boil (Skin Abscess)-A-AH

## 2023-11-22 NOTE — Telephone Encounter (Signed)
Pt states that she is at work, will not be off til 2:30 and will be coming from [place].

## 2023-11-22 NOTE — Progress Notes (Signed)
BP 138/77 (BP Location: Right Arm, Patient Position: Sitting, Cuff Size: Large)   Pulse 86   Temp 98.1 F (36.7 C) (Oral)   Ht 5\' 8"  (1.727 m)   Wt 232 lb 6.4 oz (105.4 kg)   LMP 03/15/2015   SpO2 97%   BMI 35.34 kg/m    Subjective:    Patient ID: Bianca Clay, female    DOB: [birth year], 57 y.o.   MRN: [id number]  HPI: Bianca Clay is a 57 y.o. female  Chief Complaint  Patient presents with   boils    Has Hs symptoms under right arm    Patient presents to clinic with complaints of boils under both axilla.  Has one under her left arm and 3 under her right arm.  Symptoms started about a week ago.  States she was able to get one of them to drain.  Denies any fevers, red streaks.  They are painful.     Relevant past medical, surgical, family and social history reviewed and updated as indicated. Interim medical history since our last visit reviewed. Allergies and medications reviewed and updated.  Review of Systems  Skin:        Boils under arm.    Per HPI unless specifically indicated above     Objective:    BP 138/77 (BP Location: Right Arm, Patient Position: Sitting, Cuff Size: Large)   Pulse 86   Temp 98.1 F (36.7 C) (Oral)   Ht 5\' 8"  (1.727 m)   Wt 232 lb 6.4 oz (105.4 kg)   LMP 03/15/2015   SpO2 97%   BMI 35.34 kg/m   Wt Readings from Last 3 Encounters:  11/22/23 232 lb 6.4 oz (105.4 kg)  11/09/23 232 lb 12.8 oz (105.6 kg)  06/29/23 218 lb 9.6 oz (99.2 kg)    Physical Exam Vitals and nursing note reviewed.  Constitutional:      General: She is not in acute distress.    Appearance: Normal appearance. She is normal weight. She is not ill-appearing, toxic-appearing or diaphoretic.  HENT:     Head: Normocephalic.     Right Ear: External ear normal.     Left Ear: External ear normal.     Nose: Nose normal.     Mouth/Throat:     Mouth: Mucous membranes are moist.     Pharynx: Oropharynx is clear.  Eyes:     General:        Right eye: No  discharge.        Left eye: No discharge.     Extraocular Movements: Extraocular movements intact.     Conjunctiva/sclera: Conjunctivae normal.     Pupils: Pupils are equal, round, and reactive to light.  Cardiovascular:     Rate and Rhythm: Normal rate and regular rhythm.     Heart sounds: No murmur heard. Pulmonary:     Effort: Pulmonary effort is normal. No respiratory distress.     Breath sounds: Normal breath sounds. No wheezing or rales.  Musculoskeletal:     Cervical back: Normal range of motion and neck supple.  Skin:    General: Skin is warm and dry.     Capillary Refill: Capillary refill takes less than 2 seconds.     Comments: Dime sized boil with redness in center of axilla. Pencil eraser sized boil below first boil.  Neurological:     General: No focal deficit present.     Mental Status: She is alert and oriented to person,  place, and time. Mental status is at baseline.  Psychiatric:        Mood and Affect: Mood normal.        Behavior: Behavior normal.        Thought Content: Thought content normal.        Judgment: Judgment normal.     Results for orders placed or performed in visit on 11/09/23  Vitamin D (25 hydroxy)   Collection Time: 11/09/23  8:58 AM  Result Value Ref Range   Vit D, 25-Hydroxy 37.8 30.0 - 100.0 ng/mL  Comp Met (CMET)   Collection Time: 11/09/23  8:58 AM  Result Value Ref Range   Glucose 73 70 - 99 mg/dL   BUN 11 6 - 24 mg/dL   Creatinine, Ser 1.61 0.57 - 1.00 mg/dL   eGFR 096 >04 VW/UJW/1.19   BUN/Creatinine Ratio 15 9 - 23   Sodium 140 134 - 144 mmol/L   Potassium 4.7 3.5 - 5.2 mmol/L   Chloride 104 96 - 106 mmol/L   CO2 22 20 - 29 mmol/L   Calcium 10.1 8.7 - 10.2 mg/dL   Total Protein 6.4 6.0 - 8.5 g/dL   Albumin 4.4 3.8 - 4.9 g/dL   Globulin, Total 2.0 1.5 - 4.5 g/dL   Bilirubin Total 0.2 0.0 - 1.2 mg/dL   Alkaline Phosphatase 131 (H) 44 - 121 IU/L   AST 18 0 - 40 IU/L   ALT 19 0 - 32 IU/L  Lipid Profile   Collection Time:  11/09/23  8:58 AM  Result Value Ref Range   Cholesterol, Total 191 100 - 199 mg/dL   Triglycerides 147 (H) 0 - 149 mg/dL   HDL 34 (L) >82 mg/dL   VLDL Cholesterol Cal 41 (H) 5 - 40 mg/dL   LDL Chol Calc (NIH) 956 (H) 0 - 99 mg/dL   Chol/HDL Ratio 5.6 (H) 0.0 - 4.4 ratio      Assessment & Plan:   Problem List Items Addressed This Visit   None Visit Diagnoses       Recurrent boils    -  Primary   Recurrent boils to axilla. Currently in bilateral axilla. Will treat with Bactrim. Suspect hidradenitis. Will refer to Dermatology for further management.   Relevant Medications   sulfamethoxazole-trimethoprim (BACTRIM DS) 800-160 MG tablet   Other Relevant Orders   Ambulatory referral to Dermatology        Follow up plan: Return if symptoms worsen or fail to improve.

## 2023-12-02 ENCOUNTER — Ambulatory Visit
Admission: RE | Admit: 2023-12-02 | Discharge: 2023-12-02 | Disposition: A | Discharge status: Home / Self Care | Payer: 59 | Source: Ambulatory Visit | Attending: Nurse Practitioner | Admitting: Nurse Practitioner

## 2023-12-02 DIAGNOSIS — Z1231 Encounter for screening mammogram for malignant neoplasm of breast: Secondary | ICD-10-CM | POA: Diagnosis not present

## 2023-12-04 ENCOUNTER — Other Ambulatory Visit: Payer: Self-pay | Admitting: Nurse Practitioner

## 2023-12-07 NOTE — Telephone Encounter (Signed)
 Disp Refills Start End   sertraline  (ZOLOFT ) 100 MG tablet 90 tablet 1 11/09/2023 --   Sig - Route: Take 1 tablet (100 mg total) by mouth daily. - Oral   Sent to pharmacy as: sertraline  (ZOLOFT ) 100 MG tablet   E-Prescribing Status: Receipt confirmed by pharmacy (11/09/2023  8:41 AM EST)

## 2024-03-23 ENCOUNTER — Telehealth: Payer: Self-pay | Admitting: Gastroenterology

## 2024-03-23 NOTE — Telephone Encounter (Signed)
 The patient called in to inquire whether she should schedule her next colonoscopy in one year or two years. She mentioned that Dr. Ole Berkeley had expressed concern about a polyp that was found during her previous procedure. I transferred her to Nurse [facility] for further assistance.

## 2024-04-06 ENCOUNTER — Other Ambulatory Visit: Payer: Self-pay

## 2024-04-06 DIAGNOSIS — Z860100 Personal history of colon polyps, unspecified: Secondary | ICD-10-CM

## 2024-04-06 MED ORDER — NA SULFATE-K SULFATE-MG SULF 17.5-3.13-1.6 GM/177ML PO SOLN: 1.0000 | Freq: Once | ORAL | 0 refills | Status: AC

## 2024-04-06 NOTE — Telephone Encounter (Signed)
 Colonoscopy schedule, instructions released to Mychart and mailed to pt.... prep sent to pharmacy

## 2024-05-04 ENCOUNTER — Encounter
Admission: RE | Disposition: A | Discharge status: Home / Self Care | Payer: Self-pay | Source: Home / Self Care | Attending: Gastroenterology

## 2024-05-04 ENCOUNTER — Ambulatory Visit: Admitting: Anesthesiology

## 2024-05-04 ENCOUNTER — Ambulatory Visit
Admission: RE | Admit: 2024-05-04 | Discharge: 2024-05-04 | Disposition: A | Discharge status: Home / Self Care | Attending: Gastroenterology | Admitting: Gastroenterology

## 2024-05-04 ENCOUNTER — Encounter: Payer: Self-pay | Admitting: Gastroenterology

## 2024-05-04 DIAGNOSIS — E785 Hyperlipidemia, unspecified: Secondary | ICD-10-CM | POA: Diagnosis not present

## 2024-05-04 DIAGNOSIS — K640 First degree hemorrhoids: Secondary | ICD-10-CM | POA: Insufficient documentation

## 2024-05-04 DIAGNOSIS — F17210 Nicotine dependence, cigarettes, uncomplicated: Secondary | ICD-10-CM | POA: Insufficient documentation

## 2024-05-04 DIAGNOSIS — K635 Polyp of colon: Secondary | ICD-10-CM | POA: Diagnosis not present

## 2024-05-04 DIAGNOSIS — Z79899 Other long term (current) drug therapy: Secondary | ICD-10-CM | POA: Insufficient documentation

## 2024-05-04 DIAGNOSIS — K5730 Diverticulosis of large intestine without perforation or abscess without bleeding: Secondary | ICD-10-CM | POA: Insufficient documentation

## 2024-05-04 DIAGNOSIS — F17290 Nicotine dependence, other tobacco product, uncomplicated: Secondary | ICD-10-CM | POA: Insufficient documentation

## 2024-05-04 DIAGNOSIS — F17200 Nicotine dependence, unspecified, uncomplicated: Secondary | ICD-10-CM | POA: Diagnosis not present

## 2024-05-04 DIAGNOSIS — Z860100 Personal history of colon polyps, unspecified: Secondary | ICD-10-CM

## 2024-05-04 DIAGNOSIS — F419 Anxiety disorder, unspecified: Secondary | ICD-10-CM | POA: Insufficient documentation

## 2024-05-04 DIAGNOSIS — F32A Depression, unspecified: Secondary | ICD-10-CM | POA: Diagnosis not present

## 2024-05-04 DIAGNOSIS — K6389 Other specified diseases of intestine: Secondary | ICD-10-CM | POA: Diagnosis not present

## 2024-05-04 DIAGNOSIS — Z1211 Encounter for screening for malignant neoplasm of colon: Secondary | ICD-10-CM | POA: Insufficient documentation

## 2024-05-04 DIAGNOSIS — J449 Chronic obstructive pulmonary disease, unspecified: Secondary | ICD-10-CM | POA: Diagnosis not present

## 2024-05-04 HISTORY — PX: POLYPECTOMY: SHX149

## 2024-05-04 HISTORY — PX: COLONOSCOPY: SHX5424

## 2024-05-04 SURGERY — COLONOSCOPY
Anesthesia: General

## 2024-05-04 MED ORDER — SODIUM CHLORIDE 0.9 % IV SOLN: INTRAVENOUS | Status: DC

## 2024-05-04 MED ORDER — LIDOCAINE HCL (PF) 2 % IJ SOLN
INTRAMUSCULAR | Status: AC
  Filled 2024-05-04: qty 5

## 2024-05-04 MED ORDER — DEXMEDETOMIDINE HCL IN NACL 80 MCG/20ML IV SOLN
INTRAVENOUS | Status: DC | PRN
  Administered 2024-05-04: 20 ug via INTRAVENOUS

## 2024-05-04 MED ORDER — PROPOFOL 10 MG/ML IV BOLUS
INTRAVENOUS | Status: DC | PRN
  Administered 2024-05-04: 50 mg via INTRAVENOUS
  Administered 2024-05-04 (×2): 30 mg via INTRAVENOUS
  Administered 2024-05-04: 20 mg via INTRAVENOUS

## 2024-05-04 MED ORDER — LIDOCAINE HCL (CARDIAC) PF 100 MG/5ML IV SOSY
PREFILLED_SYRINGE | INTRAVENOUS | Status: DC | PRN
  Administered 2024-05-04: 80 mg via INTRAVENOUS

## 2024-05-04 MED ORDER — PROPOFOL 500 MG/50ML IV EMUL
INTRAVENOUS | Status: DC | PRN
  Administered 2024-05-04: 75 ug/kg/min via INTRAVENOUS

## 2024-05-04 NOTE — H&P (Signed)
 Marnee Sink, MD [facility] [address], [address] [place], [place] [address] Phone:[phone] Fax : [phone]  Primary Care Physician:  Aileen Alexanders, NP Primary Gastroenterologist:  Dr. Ole Berkeley  Pre-Procedure History & Physical: HPI:  Bianca Clay is a 58 y.o. female is here for an colonoscopy.   Past Medical History:  Diagnosis Date   Anxiety    Cervical dysplasia    Depression    Galactorrhea not associated with childbirth 2011   DR. BALL   History of mammogram 09/19/2013   BIRAD I   History of Papanicolaou smear of cervix 03/29/2012   -/-    Past Surgical History:  Procedure Laterality Date   CERVICAL CONE BIOPSY  1989   CESAREAN SECTION     X1   COLONOSCOPY WITH PROPOFOL  N/A 01/15/2023   Procedure: COLONOSCOPY WITH PROPOFOL ;  Surgeon: Marnee Sink, MD;  Location: [facility];  Service: Endoscopy;  Laterality: N/A;   HYSTEROSCOPY     POLYPECTOMY  01/15/2023   Procedure: POLYPECTOMY;  Surgeon: Marnee Sink, MD;  Location: [facility];  Service: Endoscopy;;  CLIPS X 5   TONSILLECTOMY  1980   TUBAL LIGATION      Prior to Admission medications   Medication Sig Start Date End Date Taking? Authorizing Provider  albuterol  (VENTOLIN  HFA) 108 (90 Base) MCG/ACT inhaler Inhale 2 puffs into the lungs every 6 (six) hours as needed for wheezing or shortness of breath. 11/09/23  Yes Aileen Alexanders, NP  atorvastatin  (LIPITOR) 10 MG tablet Take 1 tablet (10 mg total) by mouth daily. 11/09/23  Yes Aileen Alexanders, NP  Cholecalciferol (VITAMIN D  PO) Take 2,000 Units by mouth daily.    Yes [provider]  ibuprofen (ADVIL) 200 MG tablet Take 200 mg by mouth every 6 (six) hours as needed. Take 2 tablets daily for arthritis   Yes [provider]  Multiple Vitamin (MULTIVITAMIN) capsule Take 1 capsule by mouth daily.   Yes [provider]  sertraline  (ZOLOFT ) 100 MG tablet Take 1 tablet (100 mg total) by mouth daily. 11/09/23  Yes  Aileen Alexanders, NP  triamcinolone  ointment (KENALOG ) 0.5 % Apple to affected area once daily. 11/10/23  Yes Aileen Alexanders, NP  sulfamethoxazole -trimethoprim  (BACTRIM  DS) 800-160 MG tablet Take 1 tablet by mouth 2 (two) times daily. 11/22/23   Aileen Alexanders, NP    Allergies as of 04/06/2024   (No Known Allergies)    Family History  Problem Relation Age of Onset   Diabetes Mother    Hypertension Mother    Diabetes Father    Hypertension Father    Depression Sister    Hypertension Sister    Allergies Son    Depression Sister    Breast cancer Neg Hx     Social History   Socioeconomic History   Marital status: Single    Spouse name: Not on file   Number of children: Not on file   Years of education: Not on file   Highest education level: Associate degree: academic program  Occupational History   Not on file  Tobacco Use   Smoking status: Every Day    Current packs/day: 0.50    Types: Cigarettes   Smokeless tobacco: Never   Tobacco comments:    counseling, habit replacement  Vaping Use   Vaping status: Some Days  Substance and Sexual Activity   Alcohol use: No    Alcohol/week: 0.0 standard drinks of alcohol   Drug use: No   Sexual activity: Not Currently  Birth control/protection: Other-see comments    Comment: TUBAL LIGATION  Other Topics Concern   Not on file  Social History Narrative   Not on file   Social Drivers of Health   Financial Resource Strain: Low Risk  (11/08/2023)   Overall Financial Resource Strain (CARDIA)    Difficulty of Paying Living Expenses: Not very hard  Food Insecurity: No Food Insecurity (11/08/2023)   Hunger Vital Sign    Worried About Running Out of Food in the Last Year: Never true    Ran Out of Food in the Last Year: Never true  Transportation Needs: No Transportation Needs (11/08/2023)   PRAPARE - Administrator, Civil Service (Medical): No    Lack of Transportation (Non-Medical): No  Physical Activity:  Insufficiently Active (11/22/2023)   Exercise Vital Sign    Days of Exercise per Week: 2 days    Minutes of Exercise per Session: 30 min  Stress: No Stress Concern Present (11/22/2023)   [employer] of Occupational Health - Occupational Stress Questionnaire    Feeling of Stress : Not at all  Social Connections: Unknown (11/08/2023)   Social Connection and Isolation Panel [NHANES]    Frequency of Communication with Friends and Family: More than three times a week    Frequency of Social Gatherings with Friends and Family: Once a week    Attends Religious Services: Patient declined    Database administrator or Organizations: No    Attends Engineer, structural: Not on file    Marital Status: Divorced  Catering manager Violence: Not on file    Review of Systems: See HPI, otherwise negative ROS  Physical Exam: BP 113/64   Pulse 73   Temp (!) 96.3 F (35.7 C) (Temporal)   Resp 16   Wt 104.8 kg   LMP 03/15/2015   SpO2 95%   BMI 35.12 kg/m  General:   Alert,  pleasant and cooperative in NAD Head:  Normocephalic and atraumatic. Neck:  Supple; no masses or thyromegaly. Lungs:  Clear throughout to auscultation.    Heart:  Regular rate and rhythm. Abdomen:  Soft, nontender and nondistended. Normal bowel sounds, without guarding, and without rebound.   Neurologic:  Alert and  oriented x4;  grossly normal neurologically.  Impression/Plan: Bianca Clay is here for an colonoscopy for a history of adenomatous polyps on 2024   Risks, benefits, limitations, and alternatives regarding  colonoscopy have been reviewed with the patient.  Questions have been answered.  All parties agreeable.   Marnee Sink, MD  05/04/2024, 9:18 AM

## 2024-05-04 NOTE — Transfer of Care (Signed)
 Immediate Anesthesia Transfer of Care Note  Patient: Bianca Clay  Procedure(s) Performed: COLONOSCOPY POLYPECTOMY, INTESTINE  Patient Location: PACU  Anesthesia Type:General  Level of Consciousness: sedated  Airway & Oxygen Therapy: Patient Spontanous Breathing  Post-op Assessment: Report given to RN and Post -op Vital signs reviewed and stable  Post vital signs: Reviewed and stable  Last Vitals:  Vitals Value Taken Time  BP 78/63 05/04/24 0920  Temp 35.7 C 05/04/24 0917  Pulse 71 05/04/24 0921  Resp 16 05/04/24 0921  SpO2 96 % 05/04/24 0921  Vitals shown include unfiled device data.  Last Pain:  Vitals:   05/04/24 0917  TempSrc: Temporal  PainSc: Asleep         Complications: No notable events documented.

## 2024-05-04 NOTE — Op Note (Signed)
 [facility] Gastroenterology Patient Name: Bianca Clay Procedure Date: 05/04/2024 8:51 AM MRN: [id number] Account #: [id number] Date of Birth: January 25, 1966 Admit Type: Outpatient Age: 58 Room: [facility] ENDO ROOM 4 Gender: Female Note Status: Finalized Instrument Name: Charlyn Cooley [id number] Procedure:             Colonoscopy Indications:           High risk colon cancer surveillance: Personal history                         of colonic polypswhich was a 25mm adenoma is 2024 Providers:             Marnee Sink MD, MD Referring MD:          Aileen Alexanders (Referring MD) Medicines:             Propofol  per Anesthesia Complications:         No immediate complications. Procedure:             Pre-Anesthesia Assessment:                        - Prior to the procedure, a History and Physical was                         performed, and patient medications and allergies were                         reviewed. The patient's tolerance of previous                         anesthesia was also reviewed. The risks and benefits                         of the procedure and the sedation options and risks                         were discussed with the patient. All questions were                         answered, and informed consent was obtained. Prior                         Anticoagulants: The patient has taken no anticoagulant                         or antiplatelet agents. ASA Grade Assessment: II - A                         patient with mild systemic disease. After reviewing                         the risks and benefits, the patient was deemed in                         satisfactory condition to undergo the procedure.                        After obtaining informed consent, the colonoscope was  passed under direct vision. Throughout the procedure,                         the patient's blood pressure, pulse, and oxygen                         saturations were  monitored continuously. The                         Colonoscope was introduced through the anus and                         advanced to the the cecum, identified by appendiceal                         orifice and ileocecal valve. The colonoscopy was                         performed without difficulty. The patient tolerated                         the procedure well. The quality of the bowel                         preparation was excellent. Findings:      The perianal and digital rectal examinations were normal.      A 3 mm polyp was found in the ascending colon. The polyp was sessile.       The polyp was removed with a cold snare. Resection and retrieval were       complete.      A scar was found in the ascending colon.      A few small-mouthed diverticula were found in the sigmoid colon.      Non-bleeding internal hemorrhoids were found during retroflexion. The       hemorrhoids were Grade I (internal hemorrhoids that do not prolapse). Impression:            - One 3 mm polyp in the ascending colon, removed with                         a cold snare. Resected and retrieved.                        - Scar in the ascending colon.                        - Diverticulosis in the sigmoid colon.                        - Non-bleeding internal hemorrhoids. Recommendation:        - Discharge patient to home.                        - Resume previous diet.                        - Continue present medications.                        - Await pathology results.                        -  Repeat colonoscopy in 5 years for surveillance. Procedure Code(s):     --- Professional ---                        [phone], Colonoscopy, flexible; with removal of                         tumor(s), polyp(s), or other lesion(s) by snare                         technique Diagnosis Code(s):     --- Professional ---                        Z86.010, Personal history of colonic polyps                        D12.2, Benign  neoplasm of ascending colon CPT copyright 2022 [employer]. All rights reserved. The codes documented in this report are preliminary and upon coder review may  be revised to meet current compliance requirements. Marnee Sink MD, MD 05/04/2024 9:18:19 AM This report has been signed electronically. Number of Addenda: 0 Note Initiated On: 05/04/2024 8:51 AM Scope Withdrawal Time: 0 hours 11 minutes 34 seconds  Total Procedure Duration: 0 hours 15 minutes 17 seconds  Estimated Blood Loss:  Estimated blood loss: none.      [facility]

## 2024-05-04 NOTE — Anesthesia Preprocedure Evaluation (Addendum)
 Anesthesia Evaluation  Patient identified by MRN, date of birth, ID band Patient awake    Reviewed: Allergy & Precautions, NPO status , Patient's Chart, lab work & pertinent test results  History of Anesthesia Complications Negative for: history of anesthetic complications  Airway Mallampati: III  TM Distance: >3 FB Neck ROM: full    Dental  (+) Dental Advidsory Given   Pulmonary COPD,  COPD inhaler, Current Smoker and Patient abstained from smoking.   Pulmonary exam normal        Cardiovascular (-) Past MI and (-) CABG negative cardio ROS Normal cardiovascular exam     Neuro/Psych  PSYCHIATRIC DISORDERS Anxiety Depression    negative neurological ROS     GI/Hepatic negative GI ROS, Neg liver ROS,,,  Endo/Other  negative endocrine ROS    Renal/GU negative Renal ROS  negative genitourinary   Musculoskeletal   Abdominal  (+) + obese  Peds  Hematology negative hematology ROS (+)   Anesthesia Other Findings Past Medical History: No date: Anxiety No date: Cervical dysplasia No date: Depression 2011: Galactorrhea not associated with childbirth     Comment:  DR. BALL 09/19/2013: History of mammogram     Comment:  BIRAD I 03/29/2012: History of Papanicolaou smear of cervix     Comment:  -/-  Past Surgical History: 1989: CERVICAL CONE BIOPSY No date: CESAREAN SECTION     Comment:  X1 No date: HYSTEROSCOPY 1980: TONSILLECTOMY No date: TUBAL LIGATION  BMI    Body Mass Index: 33.99 kg/m      Reproductive/Obstetrics negative OB ROS                             Anesthesia Physical Anesthesia Plan  ASA: 3  Anesthesia Plan: General   Post-op Pain Management: Minimal or no pain anticipated   Induction: Intravenous  PONV Risk Score and Plan: 3 and Propofol  infusion, TIVA and Ondansetron  Airway Management Planned: Nasal Cannula  Additional Equipment: None  Intra-op Plan:    Post-operative Plan:   Informed Consent: I have reviewed the patients History and Physical, chart, labs and discussed the procedure including the risks, benefits and alternatives for the proposed anesthesia with the patient or authorized representative who has indicated his/her understanding and acceptance.     Dental advisory given  Plan Discussed with: CRNA and Surgeon  Anesthesia Plan Comments:        Anesthesia Quick Evaluation

## 2024-05-04 NOTE — Anesthesia Postprocedure Evaluation (Signed)
 Anesthesia Post Note  Patient: YULIYA NOVA  Procedure(s) Performed: COLONOSCOPY POLYPECTOMY, INTESTINE  Patient location during evaluation: Endoscopy Anesthesia Type: General Level of consciousness: awake and alert Pain management: pain level controlled Vital Signs Assessment: post-procedure vital signs reviewed and stable Respiratory status: spontaneous breathing, nonlabored ventilation and respiratory function stable Cardiovascular status: blood pressure returned to baseline and stable Postop Assessment: no apparent nausea or vomiting Anesthetic complications: no   No notable events documented.   Last Vitals:  Vitals:   05/04/24 0917 05/04/24 0927  BP: (!) 78/63 118/80  Pulse:    Resp:    Temp: (!) 35.7 C   SpO2:      Last Pain:  Vitals:   05/04/24 0937  TempSrc:   PainSc: 0-No pain                 Baltazar Bonier

## 2024-05-08 LAB — SURGICAL PATHOLOGY

## 2024-05-09 ENCOUNTER — Ambulatory Visit: Payer: Self-pay | Admitting: Gastroenterology

## 2024-05-11 ENCOUNTER — Encounter: Payer: Self-pay | Admitting: Nurse Practitioner

## 2024-05-24 ENCOUNTER — Encounter: Admitting: Nurse Practitioner

## 2024-07-18 ENCOUNTER — Encounter: Admitting: Nurse Practitioner

## 2024-08-08 ENCOUNTER — Encounter: Payer: Self-pay | Admitting: Nurse Practitioner

## 2024-08-08 ENCOUNTER — Ambulatory Visit: Admitting: Nurse Practitioner

## 2024-08-08 VITALS — BP 113/76 | HR 76 | Temp 98.8°F | Ht 67.8 in | Wt 234.8 lb

## 2024-08-08 DIAGNOSIS — Z0000 Encounter for general adult medical examination without abnormal findings: Secondary | ICD-10-CM | POA: Diagnosis not present

## 2024-08-08 DIAGNOSIS — F17200 Nicotine dependence, unspecified, uncomplicated: Secondary | ICD-10-CM

## 2024-08-08 DIAGNOSIS — J3089 Other allergic rhinitis: Secondary | ICD-10-CM

## 2024-08-08 DIAGNOSIS — E66812 Obesity, class 2: Secondary | ICD-10-CM | POA: Diagnosis not present

## 2024-08-08 DIAGNOSIS — F331 Major depressive disorder, recurrent, moderate: Secondary | ICD-10-CM

## 2024-08-08 DIAGNOSIS — E782 Mixed hyperlipidemia: Secondary | ICD-10-CM

## 2024-08-08 DIAGNOSIS — Z23 Encounter for immunization: Secondary | ICD-10-CM | POA: Diagnosis not present

## 2024-08-08 DIAGNOSIS — Z6835 Body mass index (BMI) 35.0-35.9, adult: Secondary | ICD-10-CM

## 2024-08-08 MED ORDER — SERTRALINE HCL 100 MG PO TABS: 100.0000 mg | ORAL_TABLET | Freq: Every day | ORAL | 1 refills | Status: AC

## 2024-08-08 MED ORDER — ATORVASTATIN CALCIUM 10 MG PO TABS: 10.0000 mg | ORAL_TABLET | Freq: Every day | ORAL | 1 refills | Status: AC

## 2024-08-08 NOTE — Assessment & Plan Note (Signed)
 Fluid in L ear after recent illness, no longer symptomatic. Recommend flonase, zyrtec daily to clear it out. Follow-up as needed.

## 2024-08-08 NOTE — Assessment & Plan Note (Signed)
 Recommended eating smaller high protein, low fat meals more frequently and exercising 30 mins a day 5 times a week with a goal of 10-15lb weight loss in the next 3 months.

## 2024-08-08 NOTE — Assessment & Plan Note (Signed)
 Chronic.  Controlled.  Continue with current medication regimen of Zoloft 100mg  daily.  Labs ordered today.  Return to clinic in 6 months for reevaluation.  Call sooner if concerns arise.

## 2024-08-08 NOTE — Assessment & Plan Note (Signed)
 Chronic.  Controlled.  Continue with current medication regimen of Atorvastatin daily.  Labs ordered today.  Return to clinic in 6 months for reevaluation.  Call sooner if concerns arise.

## 2024-08-08 NOTE — Progress Notes (Addendum)
 BP 113/76   Pulse 76   Temp 98.8 F (37.1 C) (Oral)   Ht 5' 7.8 (1.722 m)   Wt 234 lb 12.8 oz (106.5 kg)   LMP 03/15/2015   SpO2 93%   BMI 35.91 kg/m    Subjective:    Patient ID: Bianca Clay, female    DOB: Nov 28, 1966, 58 y.o.   MRN: [id number]  NOTE WRITTEN BY DNP STUDENT.  ASSESSMENT AND PLAN OF CARE REVIEWED WITH STUDENT, AGREE WITH ABOVE FINDINGS AND PLAN.   HPI: Bianca Clay is a 58 y.o. female presenting on 08/08/2024 for comprehensive medical examination. Current medical complaints include clogged L ear after recent illness. Pt reports smoking less than 1 pack/day and states she is working on quitting smoking by using gradual nicotine reducing vapes.   She currently lives with: 15yo son Menopausal Symptoms: no  HYPERLIPIDEMIA Hyperlipidemia status: good compliance Satisfied with current treatment?  yes Side effects:  no Medication compliance: excellent compliance Past cholesterol meds: atorvastain (lipitor) Supplements: none Aspirin:  no The 10-year ASCVD risk score (Arnett DK, et al., 2019) is: 5.7%   Values used to calculate the score:     Age: 41 years     Clincally relevant sex: Female     Is Non-Hispanic African American: No     Diabetic: No     Tobacco smoker: Yes     Systolic Blood Pressure: 113 mmHg     Is BP treated: No     HDL Cholesterol: 34 mg/dL     Total Cholesterol: 191 mg/dL Chest pain:  no Coronary artery disease:  no Family history CAD:  no Family history early CAD:  no  DEPRESSION Patient states her mood is pretty good. She uses the hydroxyzine  on occasion.    She feels like the zoloft  is still working well for her.  Denies concerns at visit today. Does need a refill.    Depression Screen done today and results listed below:     08/08/2024    9:11 AM 08/08/2024    9:09 AM 11/22/2023    3:10 PM 11/09/2023    8:31 AM 06/29/2023    8:50 AM  Depression screen PHQ 2/9  Decreased Interest 0 0 0 0 0  Down, Depressed, Hopeless 0  0 0 0 0  PHQ - 2 Score 0 0 0 0 0  Altered sleeping 2 2  1 1   Tired, decreased energy 1 1 0 1 0  Change in appetite 1 1 0 0 0  Feeling bad or failure about yourself  0 0 0 0 0  Trouble concentrating 0 0 0 0 0  Moving slowly or fidgety/restless 0 0 0 0 0  Suicidal thoughts 0 0 0 0 0  PHQ-9 Score 4 4  2 1   Difficult doing work/chores Not difficult at all Not difficult at all   Not difficult at all    The patient does not have a history of falls. I did complete a risk assessment for falls. A plan of care for falls was documented.   Past Medical History:  Past Medical History:  Diagnosis Date   Anxiety    Arthritis    Cervical dysplasia    Depression    Galactorrhea not associated with childbirth 2011   DR. BALL   History of mammogram 09/19/2013   BIRAD I   History of Papanicolaou smear of cervix 03/29/2012   -/-    Surgical History:  Past Surgical History:  Procedure Laterality  Date   CERVICAL CONE BIOPSY  1989   CESAREAN SECTION     X1   COLONOSCOPY N/A 05/04/2024   Procedure: COLONOSCOPY;  Surgeon: Jinny Carmine, MD;  Location: [facility] END[facility]COPY;  Service: Endoscopy;  Laterality: N/A;   COLONOSCOPY WITH PROPOFOL  N/A 01/15/2023   Procedure: COLONOSCOPY WITH PROPOFOL ;  Surgeon: Jinny Carmine, MD;  Location: [facility];  Service: Endoscopy;  Laterality: N/A;   HYSTEROSCOPY     POLYPECTOMY  01/15/2023   Procedure: POLYPECTOMY;  Surgeon: Jinny Carmine, MD;  Location: [facility];  Service: Endoscopy;;  CLIPS X 5   POLYPECTOMY  05/04/2024   Procedure: POLYPECTOMY, INTESTINE;  Surgeon: Jinny Carmine, MD;  Location: [facility] ENDOSCOPY;  Service: Endoscopy;;   TONSILLECTOMY  1980   TUBAL LIGATION      Medications:  Current Outpatient Medications on File Prior to Visit  Medication Sig   albuterol  (VENTOLIN  HFA) 108 (90 Base) MCG/ACT inhaler Inhale 2 puffs into the lungs every 6 (six) hours as needed for wheezing or shortness of breath.   Cholecalciferol (VITAMIN D  PO) Take  2,000 Units by mouth daily.    ibuprofen (ADVIL) 200 MG tablet Take 200 mg by mouth every 6 (six) hours as needed. Take 2 tablets daily for arthritis   Multiple Vitamin (MULTIVITAMIN) capsule Take 1 capsule by mouth daily.   triamcinolone  ointment (KENALOG ) 0.5 % Apple to affected area once daily.   No current facility-administered medications on file prior to visit.    Allergies:  No Known Allergies  Social History:  Social History   Socioeconomic History   Marital status: Single    Spouse name: Not on file   Number of children: Not on file   Years of education: Not on file   Highest education level: Associate degree: academic program  Occupational History   Not on file  Tobacco Use   Smoking status: Every Day    Current packs/day: 0.50    Average packs/day: 0.5 packs/day for 25.7 years (12.8 ttl pk-yrs)    Types: Cigarettes    Start date: 2000   Smokeless tobacco: Never   Tobacco comments:    counseling, habit replacement  Vaping Use   Vaping status: Some Days  Substance and Sexual Activity   Alcohol use: No    Alcohol/week: 0.0 standard drinks of alcohol   Drug use: No   Sexual activity: Not Currently    Birth control/protection: Other-see comments    Comment: TUBAL LIGATION  Other Topics Concern   Not on file  Social History Narrative   Not on file   Social Drivers of Health   Financial Resource Strain: Medium Risk (08/04/2024)   Overall Financial Resource Strain (CARDIA)    Difficulty of Paying Living Expenses: Somewhat hard  Food Insecurity: Food Insecurity Present (08/04/2024)   Hunger Vital Sign    Worried About Running Out of Food in the Last Year: Sometimes true    Ran Out of Food in the Last Year: Sometimes true  Transportation Needs: No Transportation Needs (08/04/2024)   PRAPARE - Administrator, Civil Service (Medical): No    Lack of Transportation (Non-Medical): No  Physical Activity: Insufficiently Active (08/04/2024)   Exercise Vital Sign     Days of Exercise per Week: 2 days    Minutes of Exercise per Session: 20 min  Stress: No Stress Concern Present (08/04/2024)   [employer] of Occupational Health - Occupational Stress Questionnaire    Feeling of Stress: Not at all  Social  Connections: Moderately Isolated (08/04/2024)   Social Connection and Isolation Panel    Frequency of Communication with Friends and Family: More than three times a week    Frequency of Social Gatherings with Friends and Family: Once a week    Attends Religious Services: 1 to 4 times per year    Active Member of [employer] or Organizations: No    Attends Engineer, structural: Not on file    Marital Status: Divorced  Intimate Partner Violence: Not At Risk (08/08/2024)   Humiliation, Afraid, Rape, and Kick questionnaire    Fear of Current or Ex-Partner: No    Emotionally Abused: No    Physically Abused: No    Sexually Abused: No   Social History   Tobacco Use  Smoking Status Every Day   Current packs/day: 0.50   Average packs/day: 0.5 packs/day for 25.7 years (12.8 ttl pk-yrs)   Types: Cigarettes   Start date: 2000  Smokeless Tobacco Never  Tobacco Comments   counseling, habit replacement   Social History   Substance and Sexual Activity  Alcohol Use No   Alcohol/week: 0.0 standard drinks of alcohol    Family History:  Family History  Problem Relation Age of Onset   Diabetes Mother    Hypertension Mother    Diabetes Father    Hypertension Father    Kidney disease Father    Depression Sister    Hypertension Sister    Allergies Son    Depression Sister    Breast cancer Neg Hx     Past medical history, surgical history, medications, allergies, family history and social history reviewed with patient today and changes made to appropriate areas of the chart.   Review of Systems  Eyes:  Negative for blurred vision and double vision.  Respiratory:  Positive for cough. Negative for shortness of breath.   Cardiovascular:   Negative for chest pain, palpitations and leg swelling.  Neurological:  Negative for dizziness and headaches.  Psychiatric/Behavioral:  Positive for depression. Negative for suicidal ideas. The patient is nervous/anxious.    All other ROS negative except what is listed above and in the HPI.      Objective:    BP 113/76   Pulse 76   Temp 98.8 F (37.1 C) (Oral)   Ht 5' 7.8 (1.722 m)   Wt 234 lb 12.8 oz (106.5 kg)   LMP 03/15/2015   SpO2 93%   BMI 35.91 kg/m   Wt Readings from Last 3 Encounters:  08/08/24 234 lb 12.8 oz (106.5 kg)  05/04/24 231 lb (104.8 kg)  11/22/23 232 lb 6.4 oz (105.4 kg)    Physical Exam Vitals and nursing note reviewed.  Constitutional:      General: She is awake. She is not in acute distress.    Appearance: Normal appearance. She is well-developed. She is obese. She is not ill-appearing.  HENT:     Head: Normocephalic and atraumatic.     Right Ear: Hearing, tympanic membrane, ear canal and external ear normal. No drainage.     Left Ear: Hearing, ear canal and external ear normal. No drainage.     Ears:     Comments: Fluid in L ear, clear, pearly, no redness    Nose: Nose normal.     Right Sinus: No maxillary sinus tenderness or frontal sinus tenderness.     Left Sinus: No maxillary sinus tenderness or frontal sinus tenderness.     Mouth/Throat:     Mouth: Mucous membranes are  moist.     Pharynx: Oropharynx is clear. Uvula midline. No pharyngeal swelling, oropharyngeal exudate or posterior oropharyngeal erythema.  Eyes:     General: Lids are normal.        Right eye: No discharge.        Left eye: No discharge.     Extraocular Movements: Extraocular movements intact.     Conjunctiva/sclera: Conjunctivae normal.     Pupils: Pupils are equal, round, and reactive to light.     Visual Fields: Right eye visual fields normal and left eye visual fields normal.  Neck:     Thyroid: No thyromegaly.     Vascular: No carotid bruit.     Trachea: Trachea  normal.  Cardiovascular:     Rate and Rhythm: Normal rate and regular rhythm.     Heart sounds: Normal heart sounds. No murmur heard.    No gallop.  Pulmonary:     Effort: Pulmonary effort is normal. No accessory muscle usage or respiratory distress.     Breath sounds: Normal breath sounds.  Chest:  Breasts:    Right: Normal.     Left: Normal.  Abdominal:     General: Bowel sounds are normal.     Palpations: Abdomen is soft. There is no hepatomegaly or splenomegaly.     Tenderness: There is no abdominal tenderness.  Musculoskeletal:        General: Normal range of motion.     Cervical back: Normal range of motion and neck supple.     Right lower leg: No edema.     Left lower leg: No edema.  Lymphadenopathy:     Head:     Right side of head: No submental, submandibular, tonsillar, preauricular or posterior auricular adenopathy.     Left side of head: No submental, submandibular, tonsillar, preauricular or posterior auricular adenopathy.     Cervical: No cervical adenopathy.     Upper Body:     Right upper body: No supraclavicular, axillary or pectoral adenopathy.     Left upper body: No supraclavicular, axillary or pectoral adenopathy.  Skin:    General: Skin is warm and dry.     Capillary Refill: Capillary refill takes less than 2 seconds.     Findings: No rash.  Neurological:     Mental Status: She is alert and oriented to person, place, and time.     Gait: Gait is intact.     Deep Tendon Reflexes: Reflexes are normal and symmetric.     Reflex Scores:      Brachioradialis reflexes are 2+ on the right side and 2+ on the left side.      Patellar reflexes are 2+ on the right side and 2+ on the left side. Psychiatric:        Attention and Perception: Attention normal.        Mood and Affect: Mood normal.        Speech: Speech normal.        Behavior: Behavior normal. Behavior is cooperative.        Thought Content: Thought content normal.        Judgment: Judgment normal.      Results for orders placed or performed during the hospital encounter of 05/04/24  Surgical pathology   Collection Time: 05/04/24 12:00 AM  Result Value Ref Range   SURGICAL PATHOLOGY      SURGICAL PATHOLOGY [facility] [address], [address] [place], [place] [address] Telephone [phone] or [phone] Fax [phone]  REPORT OF SURGICAL PATHOLOGY   Accession #: [id number] Patient Name: SALISA, BROZ Visit # : [id number]  MRN: [id number] Physician: Jinny Carmine DOB/Age [birth year] (Age: 29) Gender: F Collected Date: 05/04/2024 Received Date: 05/04/2024  FINAL DIAGNOSIS       1. Ascending  Colon Polyp, cold snare :       - POLYPOID COLONIC MUCOSA WITH MILD HYPERPLASTIC CHANGES      - NEGATIVE FOR DYSPLASIA      - MULTIPLE STEP SECTIONS WERE EXAMINED       DATE SIGNED OUT: 05/08/2024 ELECTRONIC SIGNATURE : Rebbecca Md, Nilesh, Pathologist, Electronic Signature  MICROSCOPIC DESCRIPTION  CASE COMMENTS STAINS USED IN DIAGNOSIS: H&E *RECUT DEEPER X 9 LEVELS *RECUT DEEPER X 9 LEVELS *RECUT DEEPER X 9 LEVELS    CLINICAL HISTORY  SPECIMEN(S) OBTAINED 1. Ascending  Colon Polyp, Cold Snare  SPECIMEN COMMEN TS: SPECIMEN CLINICAL INFORMATION: 1. History of colon polyps    Gross Description 1. Cold snared ascending colon polyp, received in formalin is a single 1.0 x 0.2 x 0.1 cm tan-gray tissue fragment. The specimen is submitted in toto in 1 block (1A)      AMG 05/04/2024        Report signed out from the following location(s) [place] [address] [place], [place] [address] CLIA #: [id number]  [facility] [address] [place], [place] [address] CLIA #: [id number]       Assessment & Plan:   Problem List Items Addressed This Visit       Respiratory   Allergic rhinitis   Fluid in L ear after recent illness, no longer symptomatic. Recommend flonase, zyrtec daily to  clear it out. Follow-up as needed.        Other   Hyperlipidemia   Chronic.  Controlled.  Continue with current medication regimen of Atorvastatin  daily.  Labs ordered today.  Return to clinic in 6 months for reevaluation.  Call sooner if concerns arise.       Relevant Medications   atorvastatin  (LIPITOR) 10 MG tablet   Other Relevant Orders   Lipid panel   Class 2 severe obesity due to excess calories with serious comorbidity and body mass index (BMI) of 35.0 to 35.9 in adult [facility])   Recommended eating smaller high protein, low fat meals more frequently and exercising 30 mins a day 5 times a week with a goal of 10-15lb weight loss in the next 3 months.       Moderate episode of recurrent major depressive disorder (HCC)   Chronic.  Controlled.  Continue with current medication regimen of Zoloft  100mg  daily.  Labs ordered today.  Return to clinic in 6 months for reevaluation.  Call sooner if concerns arise.        Relevant Medications   sertraline  (ZOLOFT ) 100 MG tablet   Other Visit Diagnoses       Annual physical exam    -  Primary   Health maintenance reviewed.  Labs ordered.  Vaccines reviewed.  WIll get PAP next month.  Cologuard up to date. CT Lung ordered. Mammo up to date.   Relevant Orders   CBC with Differential/Platelet   Comprehensive metabolic panel with GFR   Lipid panel   TSH     Current every day smoker       Relevant Orders   CT CHEST LUNG CA SCREEN LOW DOSE W/O CM     Need for tetanus booster  Relevant Orders   Td : Tetanus/diphtheria >7yo Preservative  free (Completed)     Need for influenza vaccination       Relevant Orders   Flu vaccine trivalent PF, 6mos and older(Flulaval,Afluria,Fluarix,Fluzone) (Completed)        Follow up plan: Return in about 2 months (around 10/08/2024) for PAP.   LABORATORY TESTING:  - Pap smear: up to date  IMMUNIZATIONS:   - Tdap: Tetanus vaccination status reviewed: last tetanus booster within 10 years. -  Influenza: Up to date - Pneumovax: Refused - Prevnar: Not applicable - COVID: Up to date - HPV: Not applicable - Shingrix vaccine: will get at next visit  SCREENING: -Mammogram: Up to date  - Colonoscopy: ordered today - Bone Density: Not applicable  -Hearing Test: Not applicable  -Spirometry: Not applicable   PATIENT COUNSELING:   Advised to take 1 mg of folate supplement per day if capable of pregnancy.   Sexuality: Discussed sexually transmitted diseases, partner selection, use of condoms, avoidance of unintended pregnancy  and contraceptive alternatives.   Advised to avoid cigarette smoking.  I discussed with the patient that most people either abstain from alcohol or drink within safe limits (<=14/week and <=4 drinks/occasion for males, <=7/weeks and <= 3 drinks/occasion for females) and that the risk for alcohol disorders and other health effects rises proportionally with the number of drinks per week and how often a drinker exceeds daily limits.  Discussed cessation/primary prevention of drug use and availability of treatment for abuse.   Diet: Encouraged to adjust caloric intake to maintain  or achieve ideal body weight, to reduce intake of dietary saturated fat and total fat, to limit sodium intake by avoiding high sodium foods and not adding table salt, and to maintain adequate dietary potassium and calcium  preferably from fresh fruits, vegetables, and low-fat dairy products.    stressed the importance of regular exercise  Injury prevention: Discussed safety belts, safety helmets, smoke detector, smoking near bedding or upholstery.   Dental health: Discussed importance of regular tooth brushing, flossing, and dental visits.    NEXT PREVENTATIVE PHYSICAL DUE IN 1 YEAR. Return in about 2 months (around 10/08/2024) for PAP.

## 2024-08-09 ENCOUNTER — Ambulatory Visit: Payer: Self-pay | Admitting: Nurse Practitioner

## 2024-08-09 LAB — CBC WITH DIFFERENTIAL/PLATELET
Basophils Absolute: 0 x10E3/uL (ref 0.0–0.2)
Basos: 0 %
EOS (ABSOLUTE): 0.3 x10E3/uL (ref 0.0–0.4)
Eos: 3 %
Hematocrit: 42.1 % (ref 34.0–46.6)
Hemoglobin: 13.5 g/dL (ref 11.1–15.9)
Immature Grans (Abs): 0 x10E3/uL (ref 0.0–0.1)
Immature Granulocytes: 0 %
Lymphocytes Absolute: 3 x10E3/uL (ref 0.7–3.1)
Lymphs: 36 %
MCH: 29.2 pg (ref 26.6–33.0)
MCHC: 32.1 g/dL (ref 31.5–35.7)
MCV: 91 fL (ref 79–97)
Monocytes Absolute: 0.5 x10E3/uL (ref 0.1–0.9)
Monocytes: 7 %
Neutrophils Absolute: 4.4 x10E3/uL (ref 1.4–7.0)
Neutrophils: 54 %
Platelets: 380 x10E3/uL (ref 150–450)
RBC: 4.63 x10E6/uL (ref 3.77–5.28)
RDW: 12.9 % (ref 11.7–15.4)
WBC: 8.2 x10E3/uL (ref 3.4–10.8)

## 2024-08-09 LAB — COMPREHENSIVE METABOLIC PANEL WITH GFR
ALT: 24 IU/L (ref 0–32)
AST: 22 IU/L (ref 0–40)
Albumin: 4 g/dL (ref 3.8–4.9)
Alkaline Phosphatase: 126 IU/L — ABNORMAL HIGH (ref 44–121)
BUN/Creatinine Ratio: 13 (ref 9–23)
BUN: 8 mg/dL (ref 6–24)
Bilirubin Total: 0.2 mg/dL (ref 0.0–1.2)
CO2: 24 mmol/L (ref 20–29)
Calcium: 9.7 mg/dL (ref 8.7–10.2)
Chloride: 103 mmol/L (ref 96–106)
Creatinine, Ser: 0.62 mg/dL (ref 0.57–1.00)
Globulin, Total: 2.4 g/dL (ref 1.5–4.5)
Glucose: 88 mg/dL (ref 70–99)
Potassium: 4.2 mmol/L (ref 3.5–5.2)
Sodium: 140 mmol/L (ref 134–144)
Total Protein: 6.4 g/dL (ref 6.0–8.5)
eGFR: 105 mL/min/1.73 (ref >59)

## 2024-08-09 LAB — LIPID PANEL
Chol/HDL Ratio: 6.3 ratio — ABNORMAL HIGH (ref 0.0–4.4)
Cholesterol, Total: 188 mg/dL (ref 100–199)
HDL: 30 mg/dL — ABNORMAL LOW (ref >39)
LDL Chol Calc (NIH): 125 mg/dL — ABNORMAL HIGH (ref 0–99)
Triglycerides: 184 mg/dL — ABNORMAL HIGH (ref 0–149)
VLDL Cholesterol Cal: 33 mg/dL (ref 5–40)

## 2024-08-09 LAB — TSH: TSH: 1.41 u[IU]/mL (ref 0.450–4.500)

## 2024-08-10 ENCOUNTER — Telehealth: Payer: Self-pay | Admitting: *Deleted

## 2024-08-10 ENCOUNTER — Other Ambulatory Visit: Payer: Self-pay | Admitting: *Deleted

## 2024-08-10 DIAGNOSIS — Z87891 Personal history of nicotine dependence: Secondary | ICD-10-CM

## 2024-08-10 DIAGNOSIS — Z122 Encounter for screening for malignant neoplasm of respiratory organs: Secondary | ICD-10-CM

## 2024-08-10 DIAGNOSIS — F17210 Nicotine dependence, cigarettes, uncomplicated: Secondary | ICD-10-CM

## 2024-08-10 NOTE — Telephone Encounter (Signed)
 Lung Cancer Screening Narrative/Criteria Questionnaire (Cigarette Smokers Only- No Cigars/Pipes/vapes)   Bianca Clay   SDMV:08/24/24 2:30- Kristen                                           1966-09-20              LDCT: 08/25/24 3:30- DRI A    57 y.o.   Phone: [phone]  Lung Screening Narrative (confirm age 73-77 yrs Medicare / 50-80 yrs Private pay insurance)   Insurance information:[employer]   Referring Provider:[place]   This screening involves an initial phone call with a team member from our program. It is called a shared decision making visit. The initial meeting is required by insurance and Medicare to make sure you understand the program. This appointment takes about 15-20 minutes to complete. The CT scan will completed at a separate date/time. This scan takes about 5-10 minutes to complete and you may eat and drink before and after the scan.  Criteria questions for Lung Cancer Screening:   Are you a current or former smoker? Current Age began smoking: 73   If you are a former smoker, what year did you quit smoking? (within 15 yrs)   To calculate your smoking history, I need an accurate estimate of how many packs of cigarettes you smoked per day and for how many years. (Not just the number of PPD you are now smoking)   Years smoking 37 x Packs per day 1 = Pack years 37   (at least 20 pack yrs)   (Make sure they understand that we need to know how much they have smoked in the past, not just the number of PPD they are smoking now)  Do you have a personal history of cancer?  No    Do you have a family history of cancer? No  Are you coughing up blood?  No  Have you had unexplained weight loss of 15 lbs or more in the last 6 months? No  It looks like you meet all criteria.     Additional information: N/A

## 2024-08-24 ENCOUNTER — Encounter: Payer: Self-pay | Admitting: *Deleted

## 2024-08-24 ENCOUNTER — Ambulatory Visit (INDEPENDENT_AMBULATORY_CARE_PROVIDER_SITE_OTHER): Admitting: *Deleted

## 2024-08-24 DIAGNOSIS — F17210 Nicotine dependence, cigarettes, uncomplicated: Secondary | ICD-10-CM

## 2024-08-24 DIAGNOSIS — F17200 Nicotine dependence, unspecified, uncomplicated: Secondary | ICD-10-CM

## 2024-08-24 NOTE — Patient Instructions (Addendum)

## 2024-08-24 NOTE — Progress Notes (Signed)
 Virtual Visit via Telephone Note  I connected with Bianca Clay on 08/24/24 at  2:30 PM EDT by telephone and verified that I am speaking with the correct person using two identifiers.  Location: Patient: in home Provider: [address] [place], [place], [address]    Shared Decision Making Visit Lung Cancer Screening Program [phone])   Eligibility: Age 58 y.o. Pack Years Smoking History Calculation 37 (# packs/per year x # years smoked) Recent History of coughing up blood  no Unexplained weight loss? no ( >Than 15 pounds within the last 6 months ) Prior History Lung / other cancer no (Diagnosis within the last 5 years already requiring surveillance chest CT Scans). Smoking Status Current Smoker Former Smokers: Years since quit:  NA  Quit Date: NA  Visit Components: Discussion included one or more decision making aids. yes Discussion included risk/benefits of screening. yes Discussion included potential follow up diagnostic testing for abnormal scans. yes Discussion included meaning and risk of over diagnosis. yes Discussion included meaning and risk of False Positives. yes Discussion included meaning of total radiation exposure. yes  Counseling Included: Importance of adherence to annual lung cancer LDCT screening. yes Impact of comorbidities on ability to participate in the program. yes Ability and willingness to under diagnostic treatment. yes  Smoking Cessation Counseling: Current Smokers:  Discussed importance of smoking cessation. yes Information about tobacco cessation classes and interventions provided to patient. yes Patient provided with ticket for LDCT Scan. yes Symptomatic Patient. no  Counseling NA Diagnosis Code: Tobacco Use Z72.0 Asymptomatic Patient yes  Counseling (Intermediate counseling: > three minutes counseling) H9563  Counseled patient 4 minutes regarding tobacco use.   Former Smokers:  Discussed the importance of maintaining  cigarette abstinence. yes Diagnosis Code: Personal History of Nicotine Dependence. S12.108 Information about tobacco cessation classes and interventions provided to patient. Yes Patient provided with ticket for LDCT Scan. yes Written Order for Lung Cancer Screening with LDCT placed in Epic. Yes (CT Chest Lung Cancer Screening Low Dose W/O CM) [id number] Z12.2-Screening of respiratory organs Z87.891-Personal history of nicotine dependence   Josette Ranger, RN 08/24/24

## 2024-08-25 ENCOUNTER — Ambulatory Visit
Admission: RE | Admit: 2024-08-25 | Discharge: 2024-08-25 | Disposition: A | Discharge status: Home / Self Care | Source: Ambulatory Visit | Attending: Acute Care | Admitting: Acute Care

## 2024-08-25 DIAGNOSIS — Z122 Encounter for screening for malignant neoplasm of respiratory organs: Secondary | ICD-10-CM

## 2024-08-25 DIAGNOSIS — F17210 Nicotine dependence, cigarettes, uncomplicated: Secondary | ICD-10-CM

## 2024-08-25 DIAGNOSIS — Z87891 Personal history of nicotine dependence: Secondary | ICD-10-CM

## 2024-09-05 ENCOUNTER — Telehealth: Payer: Self-pay

## 2024-09-05 ENCOUNTER — Telehealth: Payer: Self-pay | Admitting: Acute Care

## 2024-09-05 DIAGNOSIS — R911 Solitary pulmonary nodule: Secondary | ICD-10-CM

## 2024-09-05 NOTE — Telephone Encounter (Signed)
 Call Report from Tiffany  IMPRESSION: 1. Lung-RADS 3, probably benign findings. Short-term follow-up in 6 months is recommended with repeat low-dose chest CT without contrast (please use the following order, CT CHEST LCS NODULE FOLLOW-UP W/O CM). 2. Aortic Atherosclerosis (ICD10-I70.0) and Emphysema (ICD10-J43.9).

## 2024-09-05 NOTE — Telephone Encounter (Signed)
 Patient returned call. Informed her of the results of her LDCT with the recommendations for 6 month follow up scan due to 6.47mm nodule seen on first LDCT. Patient verbalized understanding and is agreeable to scan. Order placed. Results and plan sent to PCP.

## 2024-09-05 NOTE — Telephone Encounter (Signed)
 Called and left VM for pt.    IMPRESSION: 1. Lung-RADS 3, probably benign findings. Short-term follow-up in 6 months is recommended with repeat low-dose chest CT without contrast (please use the following order, CT CHEST LCS NODULE FOLLOW-UP W/O CM). 2. Aortic Atherosclerosis (ICD10-I70.0) and Emphysema (ICD10-J43.9).   Electronically Signed   By: Limin  Xu M.D.   On: 09/04/2024 16:47

## 2024-10-09 ENCOUNTER — Ambulatory Visit: Admitting: Nurse Practitioner

## 2024-11-08 ENCOUNTER — Ambulatory Visit: Admitting: Nurse Practitioner

## 2024-11-08 ENCOUNTER — Other Ambulatory Visit (HOSPITAL_COMMUNITY)
Admission: RE | Admit: 2024-11-08 | Discharge: 2024-11-08 | Disposition: A | Source: Ambulatory Visit | Attending: Nurse Practitioner | Admitting: Nurse Practitioner

## 2024-11-08 ENCOUNTER — Encounter: Payer: Self-pay | Admitting: Nurse Practitioner

## 2024-11-08 VITALS — BP 138/89 | HR 78 | Temp 98.1°F | Ht 67.8 in | Wt 232.0 lb

## 2024-11-08 DIAGNOSIS — Z124 Encounter for screening for malignant neoplasm of cervix: Secondary | ICD-10-CM

## 2024-11-08 DIAGNOSIS — Z23 Encounter for immunization: Secondary | ICD-10-CM | POA: Diagnosis not present

## 2024-11-08 NOTE — Progress Notes (Signed)
 BP 138/89 (BP Location: Right Arm, Patient Position: Sitting, Cuff Size: Normal)   Pulse 78   Temp 98.1 F (36.7 C) (Oral)   Ht 5' 7.8 (1.722 m)   Wt 232 lb (105.2 kg)   LMP 03/15/2015   SpO2 96%   BMI 35.49 kg/m    Subjective:    Patient ID: Bianca Clay, female    DOB: 24-Sep-1966, 58 y.o.   MRN: [id number]  HPI: Bianca Clay is a 58 y.o. female  Chief Complaint  Patient presents with   office visit    2 month F/u.   Patient presents to clinic for Pap smear.  Not currently having any concerns.  This is just her routine screening.      Relevant past medical, surgical, family and social history reviewed and updated as indicated. Interim medical history since our last visit reviewed. Allergies and medications reviewed and updated.  Review of Systems  All other systems reviewed and are negative.   Per HPI unless specifically indicated above     Objective:    BP 138/89 (BP Location: Right Arm, Patient Position: Sitting, Cuff Size: Normal)   Pulse 78   Temp 98.1 F (36.7 C) (Oral)   Ht 5' 7.8 (1.722 m)   Wt 232 lb (105.2 kg)   LMP 03/15/2015   SpO2 96%   BMI 35.49 kg/m   Wt Readings from Last 3 Encounters:  11/08/24 232 lb (105.2 kg)  08/08/24 234 lb 12.8 oz (106.5 kg)  05/04/24 231 lb (104.8 kg)    Physical Exam Vitals and nursing note reviewed. Exam conducted with a chaperone present Philemon Louder, CMA).  Constitutional:      General: She is not in acute distress.    Appearance: Normal appearance. She is normal weight. She is not ill-appearing, toxic-appearing or diaphoretic.  HENT:     Head: Normocephalic.     Right Ear: External ear normal.     Left Ear: External ear normal.     Nose: Nose normal.     Mouth/Throat:     Mouth: Mucous membranes are moist.     Pharynx: Oropharynx is clear.  Eyes:     General:        Right eye: No discharge.        Left eye: No discharge.     Extraocular Movements: Extraocular movements intact.      Conjunctiva/sclera: Conjunctivae normal.     Pupils: Pupils are equal, round, and reactive to light.  Cardiovascular:     Rate and Rhythm: Normal rate and regular rhythm.     Heart sounds: No murmur heard. Pulmonary:     Effort: Pulmonary effort is normal. No respiratory distress.     Breath sounds: Normal breath sounds. No wheezing or rales.  Genitourinary:    Exam position: Knee-chest position.     Vagina: Normal.     Cervix: Normal.     Adnexa: Right adnexa normal and left adnexa normal.  Musculoskeletal:     Cervical back: Normal range of motion and neck supple.  Skin:    General: Skin is warm and dry.     Capillary Refill: Capillary refill takes less than 2 seconds.  Neurological:     General: No focal deficit present.     Mental Status: She is alert and oriented to person, place, and time. Mental status is at baseline.  Psychiatric:        Mood and Affect: Mood normal.  Behavior: Behavior normal.        Thought Content: Thought content normal.        Judgment: Judgment normal.     Results for orders placed or performed in visit on 08/08/24  CBC with Differential/Platelet   Collection Time: 08/08/24  9:37 AM  Result Value Ref Range   WBC 8.2 3.4 - 10.8 x10E3/uL   RBC 4.63 3.77 - 5.28 x10E6/uL   Hemoglobin 13.5 11.1 - 15.9 g/dL   Hematocrit 57.8 65.9 - 46.6 %   MCV 91 79 - 97 fL   MCH 29.2 26.6 - 33.0 pg   MCHC 32.1 31.5 - 35.7 g/dL   RDW 87.0 88.2 - 84.5 %   Platelets 380 150 - 450 x10E3/uL   Neutrophils 54 Not Estab. %   Lymphs 36 Not Estab. %   Monocytes 7 Not Estab. %   Eos 3 Not Estab. %   Basos 0 Not Estab. %   Neutrophils Absolute 4.4 1.4 - 7.0 x10E3/uL   Lymphocytes Absolute 3.0 0.7 - 3.1 x10E3/uL   Monocytes Absolute 0.5 0.1 - 0.9 x10E3/uL   EOS (ABSOLUTE) 0.3 0.0 - 0.4 x10E3/uL   Basophils Absolute 0.0 0.0 - 0.2 x10E3/uL   Immature Granulocytes 0 Not Estab. %   Immature Grans (Abs) 0.0 0.0 - 0.1 x10E3/uL  Comprehensive metabolic panel with GFR    Collection Time: 08/08/24  9:37 AM  Result Value Ref Range   Glucose 88 70 - 99 mg/dL   BUN 8 6 - 24 mg/dL   Creatinine, Ser 9.37 0.57 - 1.00 mg/dL   eGFR 894 >40 fO/fpw/8.26   BUN/Creatinine Ratio 13 9 - 23   Sodium 140 134 - 144 mmol/L   Potassium 4.2 3.5 - 5.2 mmol/L   Chloride 103 96 - 106 mmol/L   CO2 24 20 - 29 mmol/L   Calcium  9.7 8.7 - 10.2 mg/dL   Total Protein 6.4 6.0 - 8.5 g/dL   Albumin 4.0 3.8 - 4.9 g/dL   Globulin, Total 2.4 1.5 - 4.5 g/dL   Bilirubin Total 0.2 0.0 - 1.2 mg/dL   Alkaline Phosphatase 126 (H) 44 - 121 IU/L   AST 22 0 - 40 IU/L   ALT 24 0 - 32 IU/L  Lipid panel   Collection Time: 08/08/24  9:37 AM  Result Value Ref Range   Cholesterol, Total 188 100 - 199 mg/dL   Triglycerides 815 (H) 0 - 149 mg/dL   HDL 30 (L) >60 mg/dL   VLDL Cholesterol Cal 33 5 - 40 mg/dL   LDL Chol Calc (NIH) 874 (H) 0 - 99 mg/dL   Chol/HDL Ratio 6.3 (H) 0.0 - 4.4 ratio  TSH   Collection Time: 08/08/24  9:37 AM  Result Value Ref Range   TSH 1.410 0.450 - 4.500 uIU/mL      Assessment & Plan:   Problem List Items Addressed This Visit   None Visit Diagnoses       Screening for cervical cancer    -  Primary   Relevant Orders   Cytology - PAP     Need for pneumococcal 20-valent conjugate vaccination       Relevant Orders   Pneumococcal conjugate vaccine 20-valent (Prevnar 20)        Follow up plan: No follow-ups on file.

## 2024-11-14 ENCOUNTER — Ambulatory Visit: Payer: Self-pay | Admitting: Nurse Practitioner

## 2024-11-14 LAB — CYTOLOGY - PAP
Chlamydia: NEGATIVE
Comment: NEGATIVE
Comment: NEGATIVE
Comment: NEGATIVE
Comment: NEGATIVE
Comment: NORMAL
Diagnosis: UNDETERMINED — AB
HSV1: NEGATIVE
HSV2: NEGATIVE
High risk HPV: NEGATIVE
Neisseria Gonorrhea: NEGATIVE
Trichomonas: NEGATIVE

## 2024-11-14 MED ORDER — METRONIDAZOLE 500 MG PO TABS: 500.0000 mg | ORAL_TABLET | Freq: Two times a day (BID) | ORAL | 0 refills | Status: AC

## 2025-03-12 ENCOUNTER — Ambulatory Visit: Admitting: Nurse Practitioner
